# Patient Record
Sex: Male | Born: 1940 | Race: White | Hispanic: No | Marital: Married | Smoking: Former smoker
Health system: Southern US, Community
[De-identification: ages and names within clinical notes are randomized; demographics above are authoritative.]

## PROBLEM LIST (undated history)

## (undated) DIAGNOSIS — F1010 Alcohol abuse, uncomplicated: Secondary | ICD-10-CM

## (undated) DIAGNOSIS — E782 Mixed hyperlipidemia: Secondary | ICD-10-CM

## (undated) DIAGNOSIS — R41841 Cognitive communication deficit: Secondary | ICD-10-CM

## (undated) DIAGNOSIS — I700 Atherosclerosis of aorta: Secondary | ICD-10-CM

## (undated) DIAGNOSIS — I10 Essential (primary) hypertension: Secondary | ICD-10-CM

## (undated) HISTORY — DX: Alcohol abuse, uncomplicated: F10.10

## (undated) HISTORY — DX: Mixed hyperlipidemia: E78.2

## (undated) HISTORY — DX: Essential (primary) hypertension: I10

## (undated) HISTORY — DX: Atherosclerosis of aorta: I70.0

## (undated) HISTORY — DX: Cognitive communication deficit: R41.841

---

## 2018-01-27 DIAGNOSIS — G459 Transient cerebral ischemic attack, unspecified: Secondary | ICD-10-CM | POA: Diagnosis not present

## 2018-01-27 DIAGNOSIS — Z789 Other specified health status: Secondary | ICD-10-CM | POA: Diagnosis not present

## 2018-01-27 DIAGNOSIS — M545 Low back pain: Secondary | ICD-10-CM | POA: Diagnosis not present

## 2018-01-27 DIAGNOSIS — I160 Hypertensive urgency: Secondary | ICD-10-CM | POA: Diagnosis not present

## 2018-01-27 DIAGNOSIS — R739 Hyperglycemia, unspecified: Secondary | ICD-10-CM | POA: Diagnosis not present

## 2018-01-28 DIAGNOSIS — R739 Hyperglycemia, unspecified: Secondary | ICD-10-CM | POA: Diagnosis not present

## 2018-01-28 DIAGNOSIS — I160 Hypertensive urgency: Secondary | ICD-10-CM | POA: Diagnosis not present

## 2018-01-28 DIAGNOSIS — Z789 Other specified health status: Secondary | ICD-10-CM | POA: Diagnosis not present

## 2018-01-28 DIAGNOSIS — M545 Low back pain: Secondary | ICD-10-CM | POA: Diagnosis not present

## 2018-01-28 DIAGNOSIS — I517 Cardiomegaly: Secondary | ICD-10-CM | POA: Diagnosis not present

## 2018-01-28 DIAGNOSIS — G459 Transient cerebral ischemic attack, unspecified: Secondary | ICD-10-CM | POA: Diagnosis not present

## 2018-02-05 ENCOUNTER — Other Ambulatory Visit: Payer: Self-pay | Admitting: Physician Assistant

## 2018-02-05 DIAGNOSIS — M9973 Connective tissue and disc stenosis of intervertebral foramina of lumbar region: Secondary | ICD-10-CM

## 2018-02-13 ENCOUNTER — Ambulatory Visit
Admission: RE | Admit: 2018-02-13 | Discharge: 2018-02-13 | Disposition: A | Discharge status: Home / Self Care | Payer: Medicare Other | Source: Ambulatory Visit | Attending: Physician Assistant | Admitting: Physician Assistant

## 2018-02-13 DIAGNOSIS — M9973 Connective tissue and disc stenosis of intervertebral foramina of lumbar region: Secondary | ICD-10-CM

## 2019-09-15 IMAGING — MR MR LUMBAR SPINE W/O CM
4 of 5 series · 18 of 48 positions shown · non-contrast
Comparison: None.

CLINICAL DATA: Low back pain extending down the right leg with
weakness, worsening recently.

EXAM:
MRI LUMBAR SPINE WITHOUT CONTRAST
TECHNIQUE: Multiplanar, multisequence MR imaging of the lumbar spine was
performed. No intravenous contrast was administered.

[Series 6: T2 · sagittal · 4.0mm · 0.73mm/px · 6 of 15 slices shown (1 of 2)]
[im 1/15]
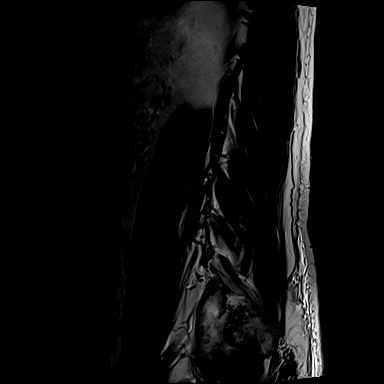
[im 3/15]
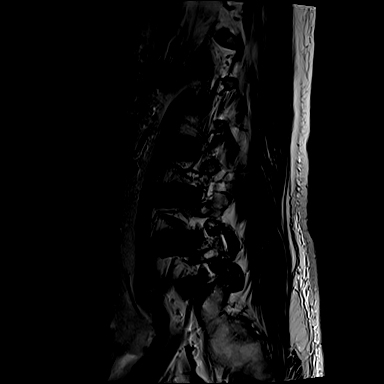
[im 6/15]
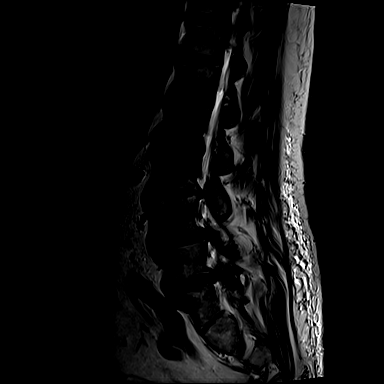
[im 9/15]
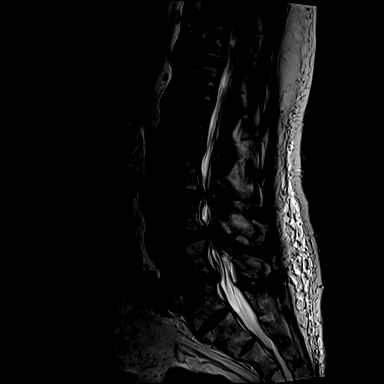
[im 12/15]
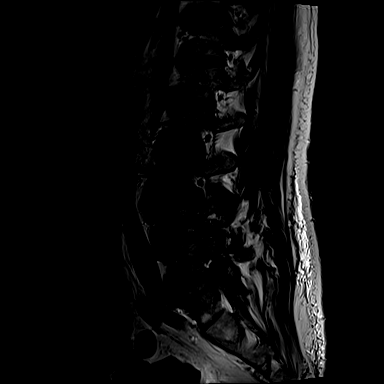
[im 15/15]
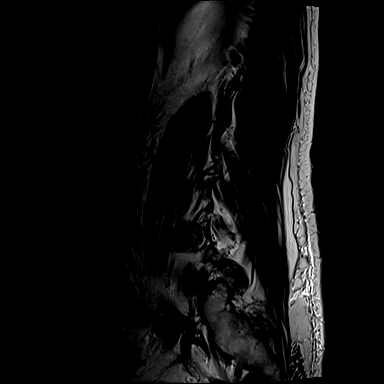

[Series 7: T1 · sagittal · 4.0mm · 0.73mm/px · 3 of 15 slices shown (1 of 2)]
[im 3/15]
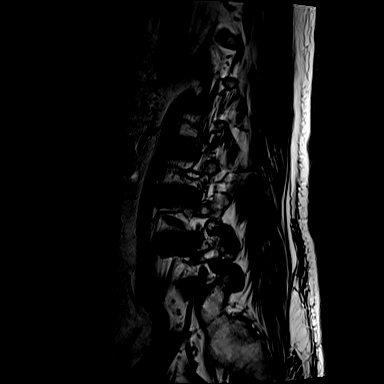
[im 9/15]
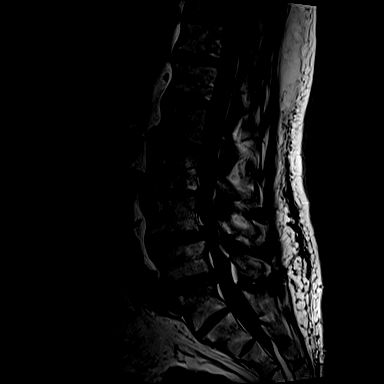
[im 15/15]
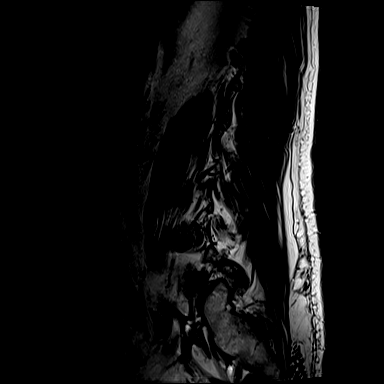

[Series 13: T2 · axial · 4.0mm · 0.28mm/px · z∈[-24,+160]mm · 6 of 40 slices shown (2 of 2)]
[im 1/40]
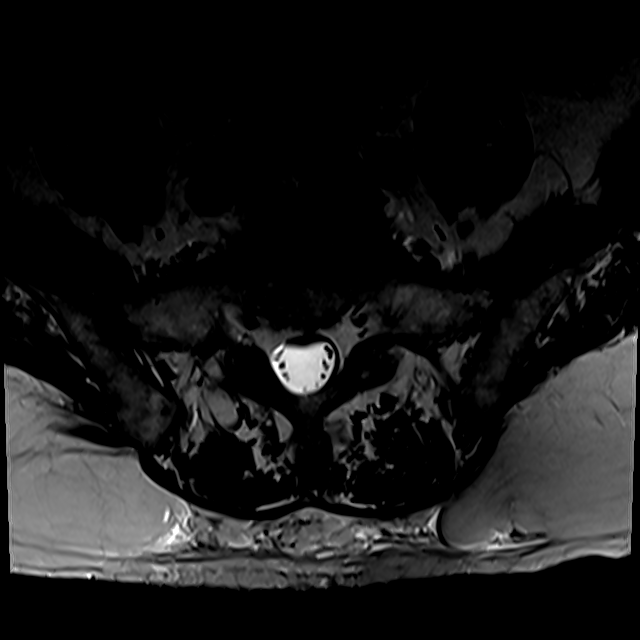
[im 6/40]
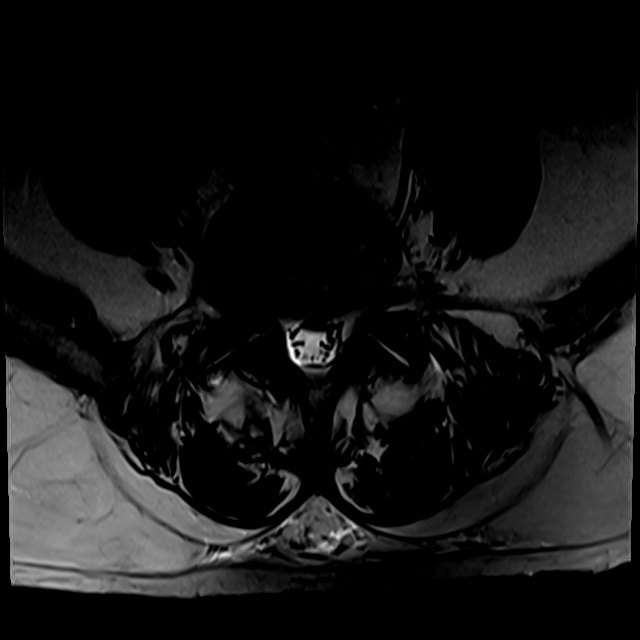
[im 12/40]
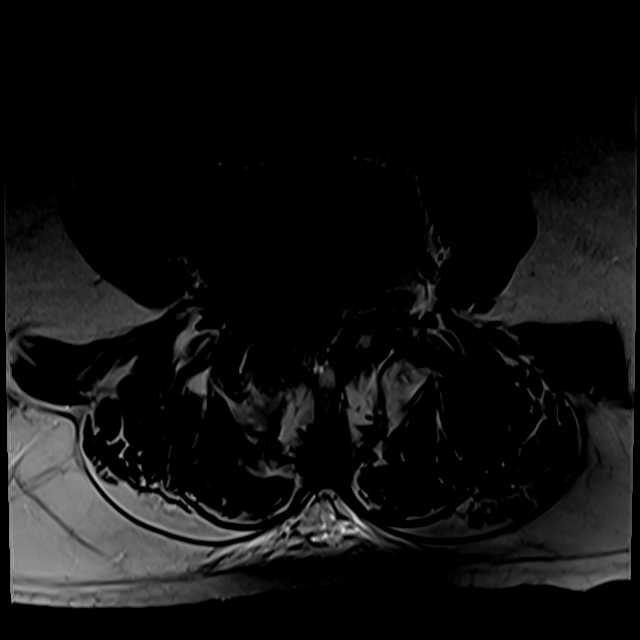
[im 17/40]
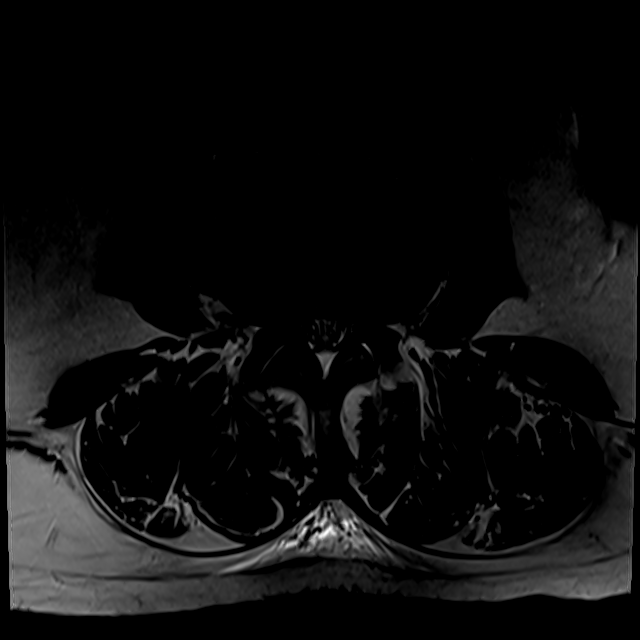
[im 20/40]
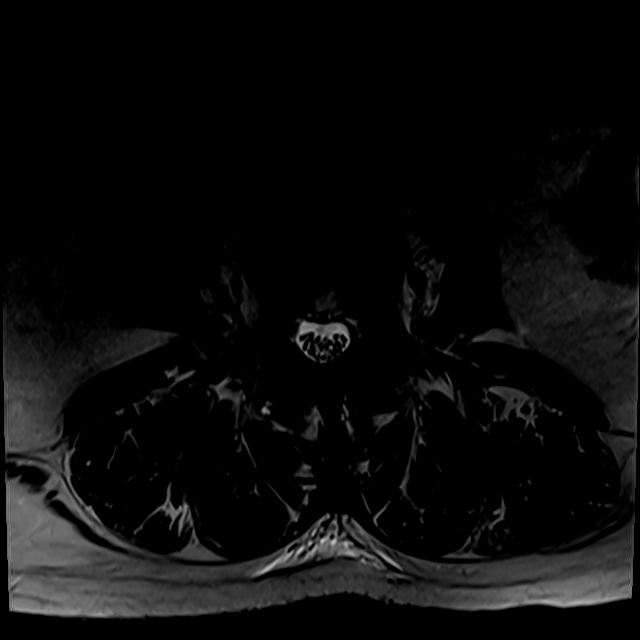
[im 34/40]
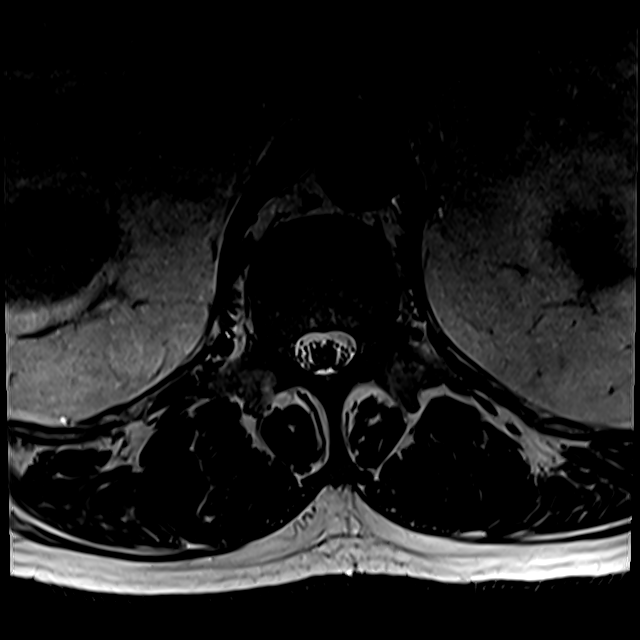

[Series 100: T1 · axial · 4.0mm · 0.28mm/px · z∈[+0,+160]mm · 3 of 40 slices shown (2 of 2)]
[im 6/40]
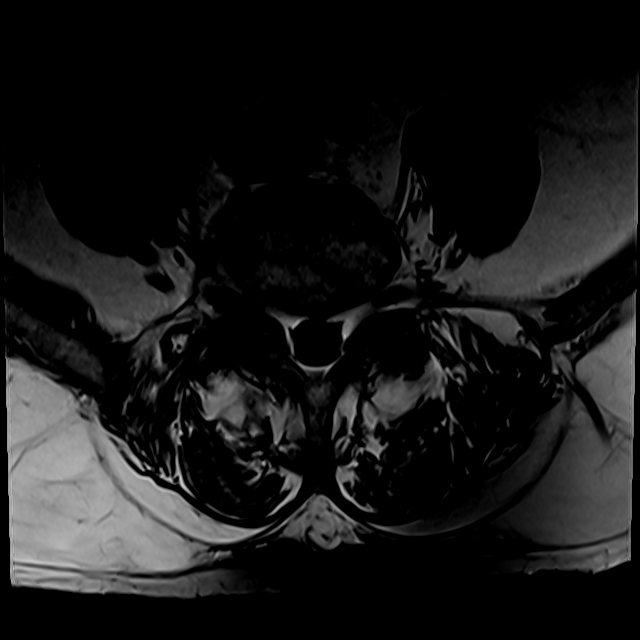
[im 20/40]
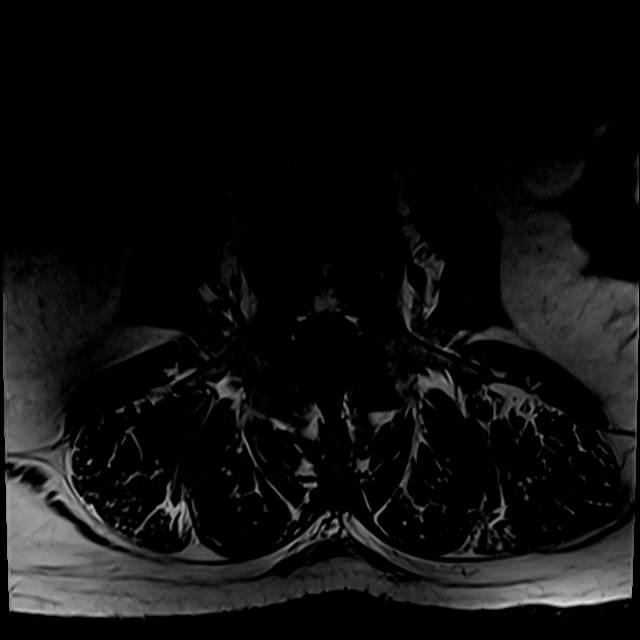
[im 34/40]
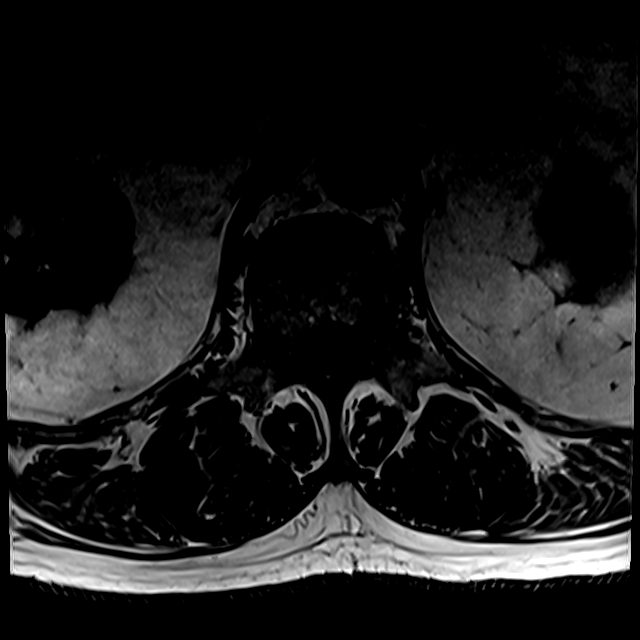

[18 of 48 positions shown; findings below may reference images not displayed]

FINDINGS: Segmentation:  S1 is described as a transitional vertebra.

Alignment: 2 mm retrolisthesis L3-4. 4 mm anterolisthesis L4-5. 2 mm
retrolisthesis L5-S1.

Vertebrae: Inferior endplate Schmorl's node at L3. Discogenic
endplate marrow changes at L5-S1.

Conus medullaris and cauda equina: Conus extends to the L1-2 level.
Conus and cauda equina appear normal.

Paraspinal and other soft tissues: Negative

Disc levels:

No significant finding at L2-3 or above. Minimal non-compressive
degenerative changes.

L3-4: Retrolisthesis of 2 mm. Disc degeneration with circumferential
bulging of the disc. Mild facet and ligamentous hypertrophy.
Stenosis of the lateral recesses that could possibly cause neural
compression.

L4-5: Advanced bilateral facet arthropathy with 4 mm of
anterolisthesis. Circumferential protrusion of the disc. Severe
multifactorial stenosis that could cause neural compression on
either or both sides. Foraminal stenosis on the right because of
osteophyte and disc material could focally compress the right L4
nerve.

L5-S1: 2 mm retrolisthesis. Circumferential disc protrusion with
small focal herniation in the left posterolateral direction. Facet
and ligamentous hypertrophy. Stenosis of the subarticular lateral
recesses left more than right could compress either S1 nerve,
particularly the left. Mild bilateral foraminal stenosis.

S1-2: Transitional and unremarkable.
IMPRESSION: S1 is described as a transitional vertebra.

The most pronounced findings are at L4-5 where there is severe
multifactorial spinal stenosis secondary to bilateral facet
arthropathy with 4 mm of anterolisthesis and circumferential
protrusion of the disc. Neural compression could occur on either
side. Additionally, there is right foraminal compromise by
osteophyte and disc material likely to focally compress the right L4
nerve.

L3-4 bilateral lateral recess narrowing with potential for neural
compression.

L5-S1 subarticular lateral recess stenosis left more than right due
to asymmetric disc herniation could compress either S1 nerve, more
likely the left.

## 2019-10-03 ENCOUNTER — Other Ambulatory Visit: Payer: Self-pay

## 2019-10-03 MED ORDER — LISINOPRIL-HYDROCHLOROTHIAZIDE 20-25 MG PO TABS: 1.0000 | ORAL_TABLET | Freq: Every day | ORAL | 1 refills | Status: DC

## 2019-11-27 ENCOUNTER — Encounter: Payer: Self-pay | Admitting: Family Medicine

## 2019-11-27 NOTE — Progress Notes (Signed)
Patient no showed

## 2019-11-30 DIAGNOSIS — E782 Mixed hyperlipidemia: Secondary | ICD-10-CM | POA: Insufficient documentation

## 2019-11-30 DIAGNOSIS — I10 Essential (primary) hypertension: Secondary | ICD-10-CM | POA: Insufficient documentation

## 2019-12-01 ENCOUNTER — Ambulatory Visit (INDEPENDENT_AMBULATORY_CARE_PROVIDER_SITE_OTHER): Payer: Medicare PPO | Admitting: Family Medicine

## 2019-12-01 DIAGNOSIS — E782 Mixed hyperlipidemia: Secondary | ICD-10-CM

## 2019-12-01 DIAGNOSIS — I10 Essential (primary) hypertension: Secondary | ICD-10-CM

## 2019-12-01 NOTE — Patient Instructions (Signed)
1. Essential hypertension Well controlled. No changes to medicines.  Continue to work on eating a healthy diet and exercise.  2. Mixed hyperlipidemia Continue to work on eating a healthy diet and exercise.

## 2019-12-10 ENCOUNTER — Other Ambulatory Visit: Payer: Self-pay | Admitting: Family Medicine

## 2019-12-17 NOTE — Progress Notes (Signed)
Subjective:  Patient ID: Walter Joseph, male    DOB: September 04, 1940  Age: 79 y.o. MRN: [id number]  Chief Complaint  Patient presents with  . Hypertension  . Hyperlipidemia  . Diabetes    HPI Pt presents for follow up of hypertension.  His current cardiac medication regimen includes a calcium channel blocker ( amlodipine ) and lisinopril/hctz.  Typical readings show systolics in the [id number] and diastolics in the 0000000 range.  He is tolerating the medication well without side effects.  Compliance with treatment has been good; he takes his medication as directed, maintains his diet and exercise regimen, and follows up as directed.  Concurrent health problems include hyperlipidemia.      Pt presents with hyperlipidemia.  Date of diagnosis 01/2018.  Current treatment includes Crestor and a low cholesterol/low fat diet.  He denies experiencing any hypercholesterolemia related symptoms.      Gemini presents with a diagnosis of impaired fasting glucose.  eating healthy.     Past Medical History:  Diagnosis Date  . Alcohol abuse   . Atherosclerosis of aorta ([place])   . Cognitive communication deficit   . Essential hypertension   . Mixed hyperlipidemia     Family History  Problem Relation Age of Onset  . Stroke Mother   . Heart failure Mother   . Diabetes Mother   . Stroke Father   . Heart failure Father   . Hypertension Father   . Heart attack Father   . Hyperlipidemia Other   . Arrhythmia Other    Social History   Socioeconomic History  . Marital status: Married    Spouse name: Not on file  . Number of children: 3  . Years of education: Not on file  . Highest education level: Not on file  Occupational History  . Occupation: retired  Tobacco Use  . Smoking status: Former Smoker    Packs/day: 1.00    Types: Cigarettes    Quit date: 1994    Years since quitting: 27.3  . Smokeless tobacco: Never Used  Substance and Sexual Activity  . Alcohol use: Yes    Alcohol/week: 2.0 - 3.0  standard drinks    Types: 2 - 3 Cans of beer per week    Comment: weekly. Been drinkning for 40 years or longer  . Drug use: Never  . Sexual activity: Not on file  Other Topics Concern  . Not on file  Social History Narrative  . Not on file   Social Determinants of Health   Financial Resource Strain:   . Difficulty of Paying Living Expenses:   Food Insecurity:   . Worried About Charity fundraiser in the Last Year:   . Arboriculturist in the Last Year:   Transportation Needs:   . Film/video editor (Medical):   Marland Kitchen Lack of Transportation (Non-Medical):   Physical Activity:   . Days of Exercise per Week:   . Minutes of Exercise per Session:   Stress:   . Feeling of Stress :   Social Connections:   . Frequency of Communication with Friends and Family:   . Frequency of Social Gatherings with Friends and Family:   . Attends Religious Services:   . Active Member of Clubs or Organizations:   . Attends Archivist Meetings:   Marland Kitchen Marital Status:     Review of Systems  Constitutional: Negative for chills, diaphoresis, fatigue and fever.  HENT: Negative for congestion, ear pain and sore throat.  Respiratory: Negative for cough and shortness of breath.   Cardiovascular: Negative for chest pain and leg swelling.  Gastrointestinal: Negative for abdominal pain, constipation, diarrhea, nausea and vomiting.  Genitourinary: Negative for dysuria and urgency.  Musculoskeletal: Negative for arthralgias and myalgias.  Neurological: Negative for dizziness and headaches.  Psychiatric/Behavioral: Negative for dysphoric mood.       No anhedonia.     Objective:  BP 122/70   Pulse 73   Temp 97.6 F (36.4 C)   Ht 5\' 11"  (1.803 m)   Wt 176 lb (79.8 kg)   SpO2 99%   BMI 24.55 kg/m   BP/Weight [id number]  Systolic BP [id number]  Diastolic BP 70  Wt. (Lbs) 176  BMI 24.55    Physical Exam Constitutional:      Appearance: Normal appearance.  Neck:     Vascular: No carotid  bruit.  Cardiovascular:     Rate and Rhythm: Normal rate and regular rhythm.     Pulses: Normal pulses.     Heart sounds: Normal heart sounds.  Pulmonary:     Effort: Pulmonary effort is normal.     Breath sounds: Normal breath sounds.  Abdominal:     General: Bowel sounds are normal.     Palpations: Abdomen is soft.     Tenderness: There is no abdominal tenderness.  Musculoskeletal:     Cervical back: Normal range of motion.     Right lower leg: No edema.     Left lower leg: No edema.  Neurological:     Mental Status: He is alert and oriented to person, place, and time.  Psychiatric:        Mood and Affect: Mood normal.        Behavior: Behavior normal.     No results found for: WBC, HGB, HCT, PLT, GLUCOSE, CHOL, TRIG, HDL, LDLDIRECT, LDLCALC, ALT, AST, NA, K, CL, CREATININE, BUN, CO2, TSH, PSA, INR, GLUF, HGBA1C, MICROALBUR    Assessment & Plan:  1. Essential hypertension Well controlled.  No changes to medicines.  Continue to work on eating a healthy diet and exercise.  Labs drawn today.  - Comprehensive metabolic panel - CBC with Differential/Platelet  2. Mixed hyperlipidemia Well controlled.  No changes to medicines.  Continue to work on eating a healthy diet and exercise.  Labs drawn today.  - Lipid panel  Orders Placed This Encounter  Procedures  . Lipid panel  . Comprehensive metabolic panel  . CBC with Differential/Platelet     Follow-up: Return in about 6 months (around 06/19/2020) for annual wellness visit with Dr. Tobie Poet, fasting. Marland Kitchen  An After Visit Summary was printed and given to the patient.  Rochel Brome [facility] [phone]

## 2019-12-19 ENCOUNTER — Other Ambulatory Visit: Payer: Self-pay

## 2019-12-19 ENCOUNTER — Encounter: Payer: Self-pay | Admitting: Family Medicine

## 2019-12-19 ENCOUNTER — Ambulatory Visit (INDEPENDENT_AMBULATORY_CARE_PROVIDER_SITE_OTHER): Payer: Medicare PPO | Admitting: Family Medicine

## 2019-12-19 VITALS — BP 122/70 | HR 73 | Temp 97.6°F | Ht 71.0 in | Wt 176.0 lb

## 2019-12-19 DIAGNOSIS — E782 Mixed hyperlipidemia: Secondary | ICD-10-CM | POA: Diagnosis not present

## 2019-12-19 DIAGNOSIS — I10 Essential (primary) hypertension: Secondary | ICD-10-CM

## 2019-12-20 LAB — COMPREHENSIVE METABOLIC PANEL
ALT: 23 IU/L (ref 0–44)
AST: 21 IU/L (ref 0–40)
Albumin/Globulin Ratio: 1.6 (ref 1.2–2.2)
Albumin: 4.6 g/dL (ref 3.7–4.7)
Alkaline Phosphatase: 67 IU/L (ref 39–117)
BUN/Creatinine Ratio: 14 (ref 10–24)
BUN: 13 mg/dL (ref 8–27)
Bilirubin Total: 0.7 mg/dL (ref 0.0–1.2)
CO2: 20 mmol/L (ref 20–29)
Calcium: 9.8 mg/dL (ref 8.6–10.2)
Chloride: 95 mmol/L — ABNORMAL LOW (ref 96–106)
Creatinine, Ser: 0.95 mg/dL (ref 0.76–1.27)
GFR calc Af Amer: 88 mL/min/{1.73_m2} (ref >59)
GFR calc non Af Amer: 76 mL/min/{1.73_m2} (ref >59)
Globulin, Total: 2.8 g/dL (ref 1.5–4.5)
Glucose: 99 mg/dL (ref 65–99)
Potassium: 4.3 mmol/L (ref 3.5–5.2)
Sodium: 133 mmol/L — ABNORMAL LOW (ref 134–144)
Total Protein: 7.4 g/dL (ref 6.0–8.5)

## 2019-12-20 LAB — CBC WITH DIFFERENTIAL/PLATELET
Basophils Absolute: 0.1 10*3/uL (ref 0.0–0.2)
Basos: 1 %
EOS (ABSOLUTE): 0.3 10*3/uL (ref 0.0–0.4)
Eos: 4 %
Hematocrit: 39.9 % (ref 37.5–51.0)
Hemoglobin: 13.9 g/dL (ref 13.0–17.7)
Immature Grans (Abs): 0.1 10*3/uL (ref 0.0–0.1)
Immature Granulocytes: 1 %
Lymphocytes Absolute: 2 10*3/uL (ref 0.7–3.1)
Lymphs: 28 %
MCH: 33.2 pg — ABNORMAL HIGH (ref 26.6–33.0)
MCHC: 34.8 g/dL (ref 31.5–35.7)
MCV: 95 fL (ref 79–97)
Monocytes Absolute: 0.7 10*3/uL (ref 0.1–0.9)
Monocytes: 10 %
Neutrophils Absolute: 3.9 10*3/uL (ref 1.4–7.0)
Neutrophils: 56 %
Platelets: 189 10*3/uL (ref 150–450)
RBC: 4.19 x10E6/uL (ref 4.14–5.80)
RDW: 11.8 % (ref 11.6–15.4)
WBC: 7 10*3/uL (ref 3.4–10.8)

## 2019-12-20 LAB — LIPID PANEL
Chol/HDL Ratio: 1.9 ratio (ref 0.0–5.0)
Cholesterol, Total: 171 mg/dL (ref 100–199)
HDL: 90 mg/dL (ref >39)
LDL Chol Calc (NIH): 70 mg/dL (ref 0–99)
Triglycerides: 55 mg/dL (ref 0–149)
VLDL Cholesterol Cal: 11 mg/dL (ref 5–40)

## 2019-12-20 LAB — CARDIOVASCULAR RISK ASSESSMENT

## 2019-12-23 ENCOUNTER — Other Ambulatory Visit: Payer: Self-pay | Admitting: Family Medicine

## 2020-03-11 ENCOUNTER — Other Ambulatory Visit: Payer: Self-pay | Admitting: Family Medicine

## 2020-05-11 ENCOUNTER — Telehealth (INDEPENDENT_AMBULATORY_CARE_PROVIDER_SITE_OTHER): Payer: Medicare PPO | Admitting: Family Medicine

## 2020-05-11 ENCOUNTER — Encounter: Payer: Self-pay | Admitting: Family Medicine

## 2020-05-11 VITALS — BP 131/74 | HR 70 | Temp 98.3°F | Ht 71.0 in | Wt 175.0 lb

## 2020-05-11 DIAGNOSIS — J018 Other acute sinusitis: Secondary | ICD-10-CM | POA: Diagnosis not present

## 2020-05-11 DIAGNOSIS — R05 Cough: Secondary | ICD-10-CM

## 2020-05-11 DIAGNOSIS — R059 Cough, unspecified: Secondary | ICD-10-CM

## 2020-05-11 MED ORDER — AZITHROMYCIN 250 MG PO TABS: ORAL_TABLET | ORAL | 0 refills | Status: DC

## 2020-05-11 NOTE — Progress Notes (Signed)
Virtual Visit via Telephone Note   This visit type was conducted due to national recommendations for restrictions regarding the COVID-19 Pandemic (e.g. social distancing) in an effort to limit this patient's exposure and mitigate transmission in our community.  Due to his co-morbid illnesses, this patient is at least at moderate risk for complications without adequate follow up.  This format is felt to be most appropriate for this patient at this time.  The patient did not have access to video technology/had technical difficulties with video requiring transitioning to audio format only (telephone).  All issues noted in this document were discussed and addressed.  No physical exam could be performed with this format.  Patient verbally consented to a telehealth visit.   Date:  05/11/2020   ID:  Walter Joseph, DOB Dec 09, 1940, MRN [id number]  Patient Location: Home Provider Location: Office/Clinic  PCP:  Rochel Brome, MD   Evaluation Performed: acute  Chief Complaint:  cough  History of Present Illness:    Walter Joseph is a 79 y.o. male with URI symptoms that started last Friday. Symptoms include coughing (mildly productive), congestion, sneezing. No sore throat. Some body aches. No fever. No headaches. Smell and taste are okay. Denies sob.   Reports taking a at home covid test yesterday which was negative. Has taken a OTC for congestion which did help some.  The patient does have symptoms concerning for COVID-19 infection (fever, chills, cough, or new shortness of breath).    Past Medical History:  Diagnosis Date  . Alcohol abuse   . Atherosclerosis of aorta ([place])   . Cognitive communication deficit   . Essential hypertension   . Mixed hyperlipidemia     History reviewed. No pertinent surgical history.  Family History  Problem Relation Age of Onset  . Stroke Mother   . Heart failure Mother   . Diabetes Mother   . Stroke Father   . Heart failure Father   . Hypertension Father   .  Heart attack Father   . Hyperlipidemia Other   . Arrhythmia Other     Social History   Socioeconomic History  . Marital status: Married    Spouse name: Not on file  . Number of children: 3  . Years of education: Not on file  . Highest education level: Not on file  Occupational History  . Occupation: retired  Tobacco Use  . Smoking status: Former Smoker    Packs/day: 1.00    Types: Cigarettes    Quit date: 1994    Years since quitting: 27.7  . Smokeless tobacco: Never Used  Substance and Sexual Activity  . Alcohol use: Yes    Alcohol/week: 2.0 - 3.0 standard drinks    Types: 2 - 3 Cans of beer per week    Comment: weekly. Been drinkning for 40 years or longer  . Drug use: Never  . Sexual activity: Not on file  Other Topics Concern  . Not on file  Social History Narrative  . Not on file   Social Determinants of Health   Financial Resource Strain:   . Difficulty of Paying Living Expenses: Not on file  Food Insecurity:   . Worried About Charity fundraiser in the Last Year: Not on file  . Ran Out of Food in the Last Year: Not on file  Transportation Needs:   . Lack of Transportation (Medical): Not on file  . Lack of Transportation (Non-Medical): Not on file  Physical Activity:   . Days of  Exercise per Week: Not on file  . Minutes of Exercise per Session: Not on file  Stress:   . Feeling of Stress : Not on file  Social Connections:   . Frequency of Communication with Friends and Family: Not on file  . Frequency of Social Gatherings with Friends and Family: Not on file  . Attends Religious Services: Not on file  . Active Member of Clubs or Organizations: Not on file  . Attends Archivist Meetings: Not on file  . Marital Status: Not on file  Intimate Partner Violence:   . Fear of Current or Ex-Partner: Not on file  . Emotionally Abused: Not on file  . Physically Abused: Not on file  . Sexually Abused: Not on file    Outpatient Medications Prior to  Visit  Medication Sig Dispense Refill  . amLODipine (NORVASC) 10 MG tablet TAKE 1 TABLET BY MOUTH ONCE (1) DAILY 90 tablet 0  . aspirin 81 MG EC tablet Take by mouth.    Marland Kitchen lisinopril-hydrochlorothiazide (ZESTORETIC) 20-25 MG tablet Take 1 tablet by mouth daily. 90 tablet 1  . rosuvastatin (CRESTOR) 5 MG tablet TAKE 1 TABLET BY MOUTH AT BEDTIME FOR CHOLESTEROL. 90 tablet 0   No facility-administered medications prior to visit.   .med Allergies:   Penicillin g and Tetracycline   Social History   Tobacco Use  . Smoking status: Former Smoker    Packs/day: 1.00    Types: Cigarettes    Quit date: 1994    Years since quitting: 27.7  . Smokeless tobacco: Never Used  Substance Use Topics  . Alcohol use: Yes    Alcohol/week: 2.0 - 3.0 standard drinks    Types: 2 - 3 Cans of beer per week    Comment: weekly. Been drinkning for 40 years or longer  . Drug use: Never     Review of Systems  Constitutional: Negative for chills and fever.  HENT: Positive for congestion, ear pain and sinus pain. Negative for sore throat.   Respiratory: Positive for cough and shortness of breath.   Gastrointestinal: Negative for diarrhea, nausea and vomiting.  Musculoskeletal: Negative for joint pain.  Neurological: Negative for dizziness and headaches.     Labs/Other Tests and Data Reviewed:    Recent Labs: 12/19/2019: ALT 23; BUN 13; Creatinine, Ser 0.95; Hemoglobin 13.9; Platelets 189; Potassium 4.3; Sodium 133   Recent Lipid Panel Lab Results  Component Value Date/Time   CHOL 171 12/19/2019 08:17 AM   TRIG 55 12/19/2019 08:17 AM   HDL 90 12/19/2019 08:17 AM   CHOLHDL 1.9 12/19/2019 08:17 AM   LDLCALC 70 12/19/2019 08:17 AM    Wt Readings from Last 3 Encounters:  05/11/20 175 lb (79.4 kg)  12/19/19 176 lb (79.8 kg)     Objective:    Vital Signs:  BP 131/74   Pulse 70   Temp 98.3 F (36.8 C)   Ht 5\' 11"  (1.803 m)   Wt 175 lb (79.4 kg)   BMI 24.41 kg/m    Physical Exam   ASSESSMENT  & PLAN:   1. Acute non-recurrent sinusitis of other sinus zpack prescribed.   2. Cough - continue otc cough medicines.  If worsens, and would like to have a send off pcr covid 19 should call us.  Recommended staying home as much as possible, masking, and keeping 6 feet distance from others.    COVID-19 Education: The signs and symptoms of COVID-19 were discussed with the patient and how to seek  care for testing (follow up with PCP or arrange E-visit). The importance of social distancing was discussed today.  Time:   Today, I have spent 10 minutes with the patient with telehealth technology discussing the above problems.    Follow Up:  In Person prn  Signed, Rochel Brome, MD  05/11/2020 10:22 AM    [place]

## 2020-06-17 NOTE — Patient Instructions (Addendum)
[name] , Thank you for taking time to come for your Medicare Wellness Visit. I appreciate your ongoing commitment to your health goals. Please review the following plan we discussed and let me know if I can assist you in the future.   These are the goals we discussed: Goals   Recommend continue to work on eating healthy diet and exercise.      Continue to drink alcohol at current level or decrease.     This is a list of the screening recommended for you and due dates:  Health Maintenance  Topic Date Due  .  Hepatitis C: One time screening is recommended by Center for Disease Control  (CDC) for  adults born from [birth year] through 1965.   Recommend to check for hepatitis C at his next blood draw.   . Tetanus Vaccine  Never done  . Pneumonia vaccines (1 of 2 - PCV13) Never done  . Flu Shot  Never done  . COVID-19 Vaccine  Completed    Preventive Care 34 Years and Older, Male Preventive care refers to lifestyle choices and visits with your health care provider that can promote health and wellness. This includes:  A yearly physical exam. This is also called an annual well check.  Regular dental and eye exams.  Immunizations.  Screening for certain conditions.  Healthy lifestyle choices, such as diet and exercise. What can I expect for my preventive care visit? Physical exam Your health care provider will check:  Height and weight. These may be used to calculate body mass index (BMI), which is a measurement that tells if you are at a healthy weight.  Heart rate and blood pressure.  Your skin for abnormal spots. Counseling Your health care provider may ask you questions about:  Alcohol, tobacco, and drug use.  Emotional well-being.  Home and relationship well-being.  Sexual activity.  Eating habits.  History of falls.  Memory and ability to understand (cognition).  Work and work Statistician. What immunizations do I need? Influenza (flu) vaccine  This is recommended  every year Pneumococcal conjugate (PCV13) vaccine  One dose is recommended after age 67. Pneumococcal polysaccharide (PPSV23) vaccine  One dose is recommended after age 72.   Recommend get vaccines below at the pharmacy as medicare covers the there. Tetanus, diphtheria, and pertussis (Tdap) vaccine  You may need a Td booster every 10 years. Shingrix (shingles) vaccine  You may need this after age 38.  You may receive vaccines as individual doses or as more than one vaccine together in one shot (combination vaccines). Talk with your health care provider about the risks and benefits of combination vaccines. What tests do I need? Blood tests (will do in April 2022  Lipid and cholesterol levels. These may be checked every 5 years, or more frequently depending on your overall health.  Hepatitis C test.  Hepatitis B test.  Screening  Lung cancer screening. You may have this screening every year starting at age 38 if you have a 30-pack-year history of smoking and currently smoke or have quit within the past 15 years.  Colorectal cancer screening. All adults should have this screening starting at age 65 and continuing until age 91. Your health care provider may recommend screening at age 14 if you are at increased risk. You will have tests every 1-10 years, depending on your results and the type of screening test.  Prostate cancer screening. Recommendations will vary depending on your family history and other risks.  Refused above  screening. Follow these instructions at home: Eating and drinking  Eat a diet that includes fresh fruits and vegetables, whole grains, lean protein, and low-fat dairy products. Limit your intake of foods with high amounts of sugar, saturated fats, and salt.  Take vitamin and mineral supplements as recommended by your health care provider.  Do not drink alcohol if your health care provider tells you not to drink.  If you drink alcohol: ? Limit how much  you have to 0-2 drinks a day. ? Be aware of how much alcohol is in your drink. In the [place] one drink equals one 12 oz bottle of beer (355 mL), one 5 oz glass of wine (148 mL), or one 1 oz glass of hard liquor (44 mL). Lifestyle  Take daily care of your teeth and gums.  Stay active. Exercise for at least 30 minutes on 5 or more days each week.  Do not use any products that contain nicotine or tobacco, such as cigarettes, e-cigarettes, and chewing tobacco. If you need help quitting, ask your health care provider.  If you are sexually active, practice safe sex. Use a condom or other form of protection to prevent STIs (sexually transmitted infections).  Talk with your health care provider about taking a low-dose aspirin or statin. What's next?  Visit your health care provider once a year for a well check visit.  Ask your health care provider how often you should have your eyes and teeth checked.  Stay up to date on all vaccines. This information is not intended to replace advice given to you by your health care provider. Make sure you discuss any questions you have with your health care provider. Document Revised: 08/01/2018 Document Reviewed: 08/01/2018 Elsevier Patient Education  2020 [employer].

## 2020-06-17 NOTE — Progress Notes (Signed)
Subjective:  Patient ID: Walter Joseph, male    DOB: [birth year]  Age: 79 y.o. MRN: [id number]  Chief Complaint  Patient presents with  . Annual Exam    HPI Well Adult Physical: Patient here for a comprehensive physical exam. Do you take any herbs or supplements that were not prescribed by a doctor? no Are you taking calcium supplements? no Are you taking aspirin daily? yes  Encounter for general adult medical examination without abnormal findings  Physical ("At Risk" items are starred): Patient's last physical exam was 1 year ago .  Smoking: Former smoker ;  Physical Activity: Exercises at least 3 times per week ;  Eats fairly healthy.  Alcohol/Drug Use: Is a drinker; No illicit drug use ;  Patient is not afflicted from Stress Incontinence and Urge Incontinence  Safety: reviewed. Patient wears a seat belt, has smoke detectors, has carbon monoxide detectors, practices appropriate gun safety, and wears sunscreen with extended sun exposure. Dental Care: biannual cleanings, brushes and flosses daily. Ophthalmology/Optometry: Annual visit.  Hearing loss: none Vision impairments: Wears glasses  PSA:    Office Visit from 06/18/2020 in [facility]  PHQ-2 Total Score 0      Current Exercise Habits: Home exercise routine, Type of exercise: walking, Time (Minutes): 30, Frequency (Times/Week): 4, Weekly Exercise (Minutes/Week): 120    Social Hx   Social History   Socioeconomic History  . Marital status: Married    Spouse name: Not on file  . Number of children: 3  . Years of education: Not on file  . Highest education level: Not on file  Occupational History  . Occupation: retired  Tobacco Use  . Smoking status: Former Smoker    Packs/day: 1.00    Types: Cigarettes    Quit date: 1994    Years since quitting: 27.8  . Smokeless tobacco: Never Used  Substance and Sexual Activity  . Alcohol use: Yes    Alcohol/week: 2.0 - 3.0 standard drinks    Types: 2 - 3 Cans of beer per  week  . Drug use: Never  . Sexual activity: Not on file  Other Topics Concern  . Not on file  Social History Narrative  . Not on file   Social Determinants of Health   Financial Resource Strain:   . Difficulty of Paying Living Expenses: Not on file  Food Insecurity:   . Worried About Charity fundraiser in the Last Year: Not on file  . Ran Out of Food in the Last Year: Not on file  Transportation Needs:   . Lack of Transportation (Medical): Not on file  . Lack of Transportation (Non-Medical): Not on file  Physical Activity:   . Days of Exercise per Week: Not on file  . Minutes of Exercise per Session: Not on file  Stress:   . Feeling of Stress : Not on file  Social Connections:   . Frequency of Communication with Friends and Family: Not on file  . Frequency of Social Gatherings with Friends and Family: Not on file  . Attends Religious Services: Not on file  . Active Member of Clubs or Organizations: Not on file  . Attends Archivist Meetings: Not on file  . Marital Status: Not on file   Past Medical History:  Diagnosis Date  . Alcohol abuse   . Atherosclerosis of aorta ([place])   . Cognitive communication deficit   . Essential hypertension   . Mixed hyperlipidemia    History reviewed. No  pertinent surgical history.  Family History  Problem Relation Age of Onset  . Stroke Mother   . Heart failure Mother   . Diabetes Mother   . Stroke Father   . Heart failure Father   . Hypertension Father   . Heart attack Father   . Hyperlipidemia Other   . Arrhythmia Other     Review of Systems  Constitutional: Negative for chills, diaphoresis, fatigue and fever.  HENT: Negative for congestion, ear pain and sore throat.   Respiratory: Negative for cough and shortness of breath.   Cardiovascular: Negative for chest pain and leg swelling.  Gastrointestinal: Negative for abdominal pain, constipation, diarrhea, nausea and vomiting.  Genitourinary: Negative for dysuria and  urgency.  Musculoskeletal: Negative for arthralgias and myalgias.  Neurological: Negative for dizziness and headaches.  Psychiatric/Behavioral: Negative for dysphoric mood.    Objective:  BP 136/82   Pulse 70   Temp 98 F (36.7 C)   Ht 5\' 11"  (1.803 m)   Wt 182 lb (82.6 kg)   SpO2 100%   BMI 25.38 kg/m   BP/Weight 06/18/2020 05/11/2020 9/92/4268  Systolic BP 341 962 229  Diastolic BP 82 74 70  Wt. (Lbs) 182 175 176  BMI 25.38 24.41 24.55    Physical Exam Vitals reviewed.  Constitutional:      Appearance: Normal appearance. He is normal weight.  Neurological:     Mental Status: He is alert and oriented to person, place, and time.  Psychiatric:        Mood and Affect: Mood normal.        Behavior: Behavior normal.     Lab Results  Component Value Date   WBC 7.0 12/19/2019   HGB 13.9 12/19/2019   HCT 39.9 12/19/2019   PLT 189 12/19/2019   GLUCOSE 99 12/19/2019   CHOL 171 12/19/2019   TRIG 55 12/19/2019   HDL 90 12/19/2019   LDLCALC 70 12/19/2019   ALT 23 12/19/2019   AST 21 12/19/2019   NA 133 (L) 12/19/2019   K 4.3 12/19/2019   CL 95 (L) 12/19/2019   CREATININE 0.95 12/19/2019   BUN 13 12/19/2019   CO2 20 12/19/2019      Assessment & Plan:  1. Encounter for annual wellness exam in Medicare patient [place] male.  The current medical regimen is effective;  continue present plan and medications. Recommend continue to work on eating healthy diet and exercise. Monitor alcohol. Do not increase.  Continue aspirin 81 mg daily.  2. Aortic atherosclerosis. The current medical regimen is effective;  continue present plan and medications. Recommend aspirin 81 mg once daily.  3. Flu vaccine given.  FLUAD.  Body mass index is 25.38 kg/m.   These are the goals we discussed: Goals   Recommend continue to work on eating healthy diet and exercise.     Watch alcohol intake     This is a list of the screening recommended for you and due dates:    Health Maintenance  Topic Date Due  . Flu Shot  06/18/2020  .  Hepatitis C: One time screening is recommended by Center for Disease Control  (CDC) for  adults born from [birth year] through 1965.   12/08/2020*  . Tetanus Vaccine  06/18/2021*  . Pneumonia vaccines (1 of 2 - PCV13) 06/18/2021*  . COVID-19 Vaccine  Completed  *Topic was postponed. The date shown is not the original due date.     AN INDIVIDUALIZED CARE PLAN: was established or  reinforced today.   SELF MANAGEMENT: The patient and I together assessed ways to personally work towards obtaining the recommended goals  Support needs The patient and/or family needs were assessed and services were offered if appropriate.  No orders of the defined types were placed in this encounter.   Follow-up: Return in about 6 months (around 12/17/2020) for fasting.  An After Visit Summary was printed and given to the patient.  Rochel Brome [facility] [phone]

## 2020-06-18 ENCOUNTER — Ambulatory Visit (INDEPENDENT_AMBULATORY_CARE_PROVIDER_SITE_OTHER): Payer: Medicare PPO | Admitting: Family Medicine

## 2020-06-18 ENCOUNTER — Encounter: Payer: Self-pay | Admitting: Family Medicine

## 2020-06-18 ENCOUNTER — Other Ambulatory Visit: Payer: Self-pay

## 2020-06-18 VITALS — BP 136/82 | HR 70 | Temp 98.0°F | Ht 71.0 in | Wt 182.0 lb

## 2020-06-18 DIAGNOSIS — Z23 Encounter for immunization: Secondary | ICD-10-CM | POA: Diagnosis not present

## 2020-06-18 DIAGNOSIS — Z0000 Encounter for general adult medical examination without abnormal findings: Secondary | ICD-10-CM

## 2020-06-18 DIAGNOSIS — I700 Atherosclerosis of aorta: Secondary | ICD-10-CM | POA: Diagnosis not present

## 2020-06-18 DIAGNOSIS — E782 Mixed hyperlipidemia: Secondary | ICD-10-CM

## 2020-06-18 DIAGNOSIS — I10 Essential (primary) hypertension: Secondary | ICD-10-CM

## 2020-06-29 ENCOUNTER — Telehealth: Payer: Self-pay

## 2020-06-29 NOTE — Telephone Encounter (Signed)
Patient left a voicemail requesting a rx for his pneumonia vaccine to be sent to the pharmacy.

## 2020-06-30 ENCOUNTER — Other Ambulatory Visit: Payer: Self-pay | Admitting: Family Medicine

## 2020-06-30 NOTE — Telephone Encounter (Signed)
Sent. Kc  °

## 2020-07-01 ENCOUNTER — Other Ambulatory Visit: Payer: Self-pay

## 2020-07-01 DIAGNOSIS — Z23 Encounter for immunization: Secondary | ICD-10-CM

## 2020-07-01 MED ORDER — PNEUMOCOCCAL VAC POLYVALENT 25 MCG/0.5ML IJ INJ: 0.5000 mL | INJECTION | Freq: Once | INTRAMUSCULAR | 0 refills | Status: AC

## 2020-07-29 ENCOUNTER — Other Ambulatory Visit: Payer: Self-pay | Admitting: Family Medicine

## 2020-08-09 ENCOUNTER — Other Ambulatory Visit: Payer: Self-pay | Admitting: Family Medicine

## 2020-09-24 ENCOUNTER — Other Ambulatory Visit: Payer: Self-pay | Admitting: Family Medicine

## 2020-12-06 ENCOUNTER — Other Ambulatory Visit: Payer: Self-pay | Admitting: Family Medicine

## 2020-12-16 NOTE — Progress Notes (Signed)
Subjective:  Patient ID: Walter Joseph, male    DOB: 1941/07/12  Age: 80 y.o. MRN: [id number]  Chief Complaint  Patient presents with  . Hypertension  . Hyperlipidemia    HPI Essential hypertension Amlodipine, Lisinopril-hctz Eating pretty healthy. Exercising.   Mixed hyperlipidemia/Carotid atherosclerosis/Atherosclerosis of aorta Crestor 5 mg once daily. Tolerating all medications.  Patient is asking if due for another carotid doppers.    Current Outpatient Medications on File Prior to Visit  Medication Sig Dispense Refill  . amLODipine (NORVASC) 10 MG tablet TAKE ONE TABLET BY MOUTH EVERY DAY 90 tablet 0  . aspirin 81 MG EC tablet Take by mouth.    Marland Kitchen lisinopril-hydrochlorothiazide (ZESTORETIC) 20-25 MG tablet TAKE 1 TABLET BY MOUTH ONCE (1) DAILY 90 tablet 1  . rosuvastatin (CRESTOR) 5 MG tablet TAKE ONE TABLET BY MOUTH AT BEDTIME FOR CHOLESTEROL 90 tablet 1   No current facility-administered medications on file prior to visit.   Past Medical History:  Diagnosis Date  . Alcohol abuse   . Atherosclerosis of aorta ([place])   . Cognitive communication deficit   . Essential hypertension   . Mixed hyperlipidemia    History reviewed. No pertinent surgical history.  Family History  Problem Relation Age of Onset  . Stroke Mother   . Heart failure Mother   . Diabetes Mother   . Stroke Father   . Heart failure Father   . Hypertension Father   . Heart attack Father   . Hyperlipidemia Other   . Arrhythmia Other    Social History   Socioeconomic History  . Marital status: Married    Spouse name: Not on file  . Number of children: 3  . Years of education: Not on file  . Highest education level: Not on file  Occupational History  . Occupation: retired  Tobacco Use  . Smoking status: Former Smoker    Packs/day: 1.00    Types: Cigarettes    Quit date: 1994    Years since quitting: 28.3  . Smokeless tobacco: Never Used  Substance and Sexual Activity  . Alcohol use: Yes     Alcohol/week: 2.0 - 3.0 standard drinks    Types: 2 - 3 Cans of beer per week  . Drug use: Never  . Sexual activity: Not on file  Other Topics Concern  . Not on file  Social History Narrative  . Not on file   Social Determinants of Health   Financial Resource Strain: Not on file  Food Insecurity: Not on file  Transportation Needs: Not on file  Physical Activity: Not on file  Stress: Not on file  Social Connections: Not on file    Review of Systems  Constitutional: Negative for chills, diaphoresis, fatigue and fever.  HENT: Negative for congestion, ear pain (Feels like he has something in his ear  (water)) and sore throat.   Respiratory: Negative for cough and shortness of breath.   Cardiovascular: Negative for chest pain and leg swelling.  Gastrointestinal: Negative for abdominal pain, constipation, diarrhea, nausea and vomiting.  Genitourinary: Negative for dysuria and urgency.  Musculoskeletal: Negative for arthralgias and myalgias.  Neurological: Negative for dizziness and headaches.  Psychiatric/Behavioral: Negative for dysphoric mood.     Objective:  BP 134/62   Pulse 78   Temp (!) 97.4 F (36.3 C)   Ht 5\' 11"  (1.803 m)   Wt 177 lb (80.3 kg)   SpO2 98%   BMI 24.69 kg/m   BP/Weight 12/17/2020 06/18/2020 0/25/8527  Systolic  BP 983 382 505  Diastolic BP 62 82 74  Wt. (Lbs) 177 182 175  BMI 24.69 25.38 24.41    Physical Exam Vitals reviewed.  Constitutional:      Appearance: Normal appearance. He is normal weight.  HENT:     Right Ear: Tympanic membrane normal.     Left Ear: Tympanic membrane normal.     Nose: Nose normal.     Mouth/Throat:     Pharynx: No oropharyngeal exudate or posterior oropharyngeal erythema.  Neck:     Vascular: No carotid bruit.  Cardiovascular:     Rate and Rhythm: Normal rate and regular rhythm.     Heart sounds: No murmur heard.   Pulmonary:     Effort: Pulmonary effort is normal.     Breath sounds: Normal breath  sounds.  Abdominal:     General: Abdomen is flat. Bowel sounds are normal.     Palpations: Abdomen is soft.     Tenderness: There is no abdominal tenderness.  Neurological:     Mental Status: He is alert and oriented to person, place, and time.  Psychiatric:        Mood and Affect: Mood normal.        Behavior: Behavior normal.     Diabetic Foot Exam - Simple   No data filed      Lab Results  Component Value Date   WBC 7.0 12/19/2019   HGB 13.9 12/19/2019   HCT 39.9 12/19/2019   PLT 189 12/19/2019   GLUCOSE 99 12/19/2019   CHOL 171 12/19/2019   TRIG 55 12/19/2019   HDL 90 12/19/2019   LDLCALC 70 12/19/2019   ALT 23 12/19/2019   AST 21 12/19/2019   NA 133 (L) 12/19/2019   K 4.3 12/19/2019   CL 95 (L) 12/19/2019   CREATININE 0.95 12/19/2019   BUN 13 12/19/2019   CO2 20 12/19/2019      Assessment & Plan:   1. Essential hypertension Well controlled.  No changes to medicines.  Continue to work on eating a healthy diet and exercise.  Labs drawn today.  - Comprehensive metabolic panel  2. Mixed hyperlipidemia Well controlled.  No changes to medicines.  Continue to work on eating a healthy diet and exercise.  Labs drawn today.  - CBC with Differential/Platelet - Lipid panel  3. Atherosclerosis of both carotid arteries - US Carotid Duplex Bilateral  - continue crestor and aspirin.  4. Aortic atherosclerosis - continue crestor and aspirin.  No orders of the defined types were placed in this encounter.   Orders Placed This Encounter  Procedures  . US Carotid Duplex Bilateral  . CBC with Differential/Platelet  . Comprehensive metabolic panel  . Lipid panel     Follow-up: No follow-ups on file.  An After Visit Summary was printed and given to the patient.  Rochel Brome, MD [facility] [phone]

## 2020-12-17 ENCOUNTER — Ambulatory Visit: Payer: Medicare PPO | Admitting: Family Medicine

## 2020-12-17 ENCOUNTER — Other Ambulatory Visit: Payer: Self-pay

## 2020-12-17 ENCOUNTER — Encounter: Payer: Self-pay | Admitting: Family Medicine

## 2020-12-17 VITALS — BP 134/62 | HR 78 | Temp 97.4°F | Ht 71.0 in | Wt 177.0 lb

## 2020-12-17 DIAGNOSIS — I10 Essential (primary) hypertension: Secondary | ICD-10-CM | POA: Diagnosis not present

## 2020-12-17 DIAGNOSIS — I6523 Occlusion and stenosis of bilateral carotid arteries: Secondary | ICD-10-CM

## 2020-12-17 DIAGNOSIS — I700 Atherosclerosis of aorta: Secondary | ICD-10-CM | POA: Diagnosis not present

## 2020-12-17 DIAGNOSIS — E782 Mixed hyperlipidemia: Secondary | ICD-10-CM

## 2020-12-18 LAB — CBC WITH DIFFERENTIAL/PLATELET
Basophils Absolute: 0.1 10*3/uL (ref 0.0–0.2)
Basos: 1 %
EOS (ABSOLUTE): 0.3 10*3/uL (ref 0.0–0.4)
Eos: 5 %
Hematocrit: 40.3 % (ref 37.5–51.0)
Hemoglobin: 14 g/dL (ref 13.0–17.7)
Immature Grans (Abs): 0 10*3/uL (ref 0.0–0.1)
Immature Granulocytes: 1 %
Lymphocytes Absolute: 2.3 10*3/uL (ref 0.7–3.1)
Lymphs: 31 %
MCH: 32.1 pg (ref 26.6–33.0)
MCHC: 34.7 g/dL (ref 31.5–35.7)
MCV: 92 fL (ref 79–97)
Monocytes Absolute: 0.8 10*3/uL (ref 0.1–0.9)
Monocytes: 10 %
Neutrophils Absolute: 3.8 10*3/uL (ref 1.4–7.0)
Neutrophils: 52 %
Platelets: 186 10*3/uL (ref 150–450)
RBC: 4.36 x10E6/uL (ref 4.14–5.80)
RDW: 11.7 % (ref 11.6–15.4)
WBC: 7.2 10*3/uL (ref 3.4–10.8)

## 2020-12-18 LAB — COMPREHENSIVE METABOLIC PANEL
ALT: 28 IU/L (ref 0–44)
AST: 26 IU/L (ref 0–40)
Albumin/Globulin Ratio: 1.5 (ref 1.2–2.2)
Albumin: 4.6 g/dL (ref 3.7–4.7)
Alkaline Phosphatase: 70 IU/L (ref 44–121)
BUN/Creatinine Ratio: 11 (ref 10–24)
BUN: 10 mg/dL (ref 8–27)
Bilirubin Total: 0.7 mg/dL (ref 0.0–1.2)
CO2: 22 mmol/L (ref 20–29)
Calcium: 9.7 mg/dL (ref 8.6–10.2)
Chloride: 90 mmol/L — ABNORMAL LOW (ref 96–106)
Creatinine, Ser: 0.95 mg/dL (ref 0.76–1.27)
Globulin, Total: 3 g/dL (ref 1.5–4.5)
Glucose: 107 mg/dL — ABNORMAL HIGH (ref 65–99)
Potassium: 4.2 mmol/L (ref 3.5–5.2)
Sodium: 128 mmol/L — ABNORMAL LOW (ref 134–144)
Total Protein: 7.6 g/dL (ref 6.0–8.5)
eGFR: 81 mL/min/{1.73_m2} (ref >59)

## 2020-12-18 LAB — LIPID PANEL
Chol/HDL Ratio: 1.8 ratio (ref 0.0–5.0)
Cholesterol, Total: 163 mg/dL (ref 100–199)
HDL: 91 mg/dL (ref >39)
LDL Chol Calc (NIH): 61 mg/dL (ref 0–99)
Triglycerides: 53 mg/dL (ref 0–149)
VLDL Cholesterol Cal: 11 mg/dL (ref 5–40)

## 2020-12-18 LAB — CARDIOVASCULAR RISK ASSESSMENT

## 2020-12-22 ENCOUNTER — Telehealth: Payer: Self-pay | Admitting: Family Medicine

## 2020-12-22 LAB — SPECIMEN STATUS REPORT

## 2020-12-22 LAB — HGB A1C W/O EAG: Hgb A1c MFr Bld: 5.3 % (ref 4.8–5.6)

## 2020-12-22 NOTE — Telephone Encounter (Signed)
   Walter Joseph has been scheduled for the following appointment:  WHAT: Vascular Ultrasound WHERE: [facility] DATE: 12/24/20 TIME: 2:30  Patient has been made aware.

## 2020-12-22 NOTE — Telephone Encounter (Signed)
error 

## 2020-12-23 ENCOUNTER — Other Ambulatory Visit: Payer: Self-pay | Admitting: Family Medicine

## 2020-12-28 DIAGNOSIS — I6523 Occlusion and stenosis of bilateral carotid arteries: Secondary | ICD-10-CM | POA: Diagnosis not present

## 2020-12-28 DIAGNOSIS — I10 Essential (primary) hypertension: Secondary | ICD-10-CM | POA: Diagnosis not present

## 2020-12-28 DIAGNOSIS — E782 Mixed hyperlipidemia: Secondary | ICD-10-CM | POA: Diagnosis not present

## 2020-12-28 DIAGNOSIS — I2510 Atherosclerotic heart disease of native coronary artery without angina pectoris: Secondary | ICD-10-CM | POA: Diagnosis not present

## 2021-01-11 DIAGNOSIS — H2513 Age-related nuclear cataract, bilateral: Secondary | ICD-10-CM | POA: Diagnosis not present

## 2021-01-18 ENCOUNTER — Other Ambulatory Visit: Payer: Self-pay | Admitting: Family Medicine

## 2021-03-08 ENCOUNTER — Other Ambulatory Visit: Payer: Self-pay | Admitting: Family Medicine

## 2021-04-08 ENCOUNTER — Other Ambulatory Visit: Payer: Self-pay | Admitting: Family Medicine

## 2021-06-10 ENCOUNTER — Other Ambulatory Visit: Payer: Self-pay | Admitting: Family Medicine

## 2021-06-13 NOTE — Telephone Encounter (Signed)
Refill sent to pharmacy.   

## 2021-06-29 ENCOUNTER — Other Ambulatory Visit: Payer: Self-pay

## 2021-06-29 ENCOUNTER — Ambulatory Visit (INDEPENDENT_AMBULATORY_CARE_PROVIDER_SITE_OTHER): Payer: Medicare PPO

## 2021-06-29 VITALS — BP 138/60 | HR 75 | Resp 16 | Ht 71.0 in | Wt 173.4 lb

## 2021-06-29 DIAGNOSIS — Z0000 Encounter for general adult medical examination without abnormal findings: Secondary | ICD-10-CM

## 2021-06-29 DIAGNOSIS — M545 Low back pain, unspecified: Secondary | ICD-10-CM | POA: Diagnosis not present

## 2021-06-29 DIAGNOSIS — Z23 Encounter for immunization: Secondary | ICD-10-CM | POA: Diagnosis not present

## 2021-06-29 MED ORDER — ZOSTER VAC RECOMB ADJUVANTED 50 MCG/0.5ML IM SUSR: 0.5000 mL | Freq: Once | INTRAMUSCULAR | 0 refills | Status: AC

## 2021-06-29 NOTE — Patient Instructions (Signed)

## 2021-06-29 NOTE — Progress Notes (Signed)
Subjective:   Walter Joseph is a 80 y.o. male who presents for Medicare Annual/Subsequent preventive examination.  This wellness visit is conducted by a nurse.  The patient's medications were reviewed and reconciled since the patient's last visit.  History details were provided by the patient.  The history appears to be reliable.    Patient's last AWV was one year ago.   Medical History: Patient history and Family history was reviewed  Medications, Allergies, and preventative health maintenance was reviewed and updated.  Walter Joseph also complains of back pain and is requesting a steroid dose pack.    Review of Systems    Review of Systems  Constitutional: Negative.   HENT: Negative.    Eyes: Negative.        Bilateral cataract  Respiratory: Negative.  Negative for cough and shortness of breath.   Cardiovascular: Negative.  Negative for chest pain and palpitations.  Gastrointestinal: Negative.   Genitourinary: Negative.   Musculoskeletal:  Positive for back pain.  Neurological: Negative.   Psychiatric/Behavioral: Negative.     Cardiac Risk Factors include: advanced age (>37men, >34 women);hypertension;male gender     Objective:    Today's Vitals   06/29/21 1453  BP: 138/60  Pulse: 75  Resp: 16  SpO2: 98%  Weight: 173 lb 6.4 oz (78.7 kg)  Height: 5\' 11"  (1.803 m)  PainSc: 7   PainLoc: Back   Body mass index is 24.18 kg/m.  Advanced Directives 06/29/2021 06/18/2020 06/18/2020  Does Patient Have a Medical Advance Directive? Yes - Yes  Type of Advance Directive [place];Living will (No Data) [place];Living will  Does patient want to make changes to medical advance directive? No - Patient declined - -  Copy of [place] in Chart? No - copy requested - -    Current Medications (verified) Outpatient Encounter Medications as of 06/29/2021  Medication Sig   amLODipine (NORVASC) 10 MG tablet TAKE ONE TABLET BY MOUTH EVERY  DAY   aspirin 81 MG EC tablet Take by mouth.   rosuvastatin (CRESTOR) 5 MG tablet TAKE ONE TABLET BY MOUTH AT BEDTIME FOR CHOLESTEROL   lisinopril-hydrochlorothiazide (ZESTORETIC) 20-25 MG tablet TAKE 1 TABLET BY MOUTH ONCE (1) DAILY (Patient not taking: Reported on 06/29/2021)   No facility-administered encounter medications on file as of 06/29/2021.    Allergies (verified) Penicillin g and Tetracycline   History: Past Medical History:  Diagnosis Date   Alcohol abuse    Atherosclerosis of aorta ([facility])    Cognitive communication deficit    Essential hypertension    Mixed hyperlipidemia    No past surgical history on file. Family History  Problem Relation Age of Onset   Stroke Mother    Heart failure Mother    Diabetes Mother    Stroke Father    Heart failure Father    Hypertension Father    Heart attack Father    Hyperlipidemia Other    Arrhythmia Other    Social History   Socioeconomic History   Marital status: Married   Number of children: 3  Occupational History   Occupation: retired  Tobacco Use   Smoking status: Former    Packs/day: 1.00    Types: Cigarettes    Quit date: 1994    Years since quitting: 28.8   Smokeless tobacco: Never  Substance and Sexual Activity   Alcohol use: Yes    Alcohol/week: 2.0 - 3.0 standard drinks    Types: 2 - 3  Cans of beer per week   Drug use: Never   Sexual activity: Not on file   Social Determinants of Health   Financial Resource Strain: Not on file  Food Insecurity: No Food Insecurity   Worried About Running Out of Food in the Last Year: Never true   Ran Out of Food in the Last Year: Never true  Transportation Needs: No Transportation Needs   Lack of Transportation (Medical): No   Lack of Transportation (Non-Medical): No  Physical Activity: Inactive   Days of Exercise per Week: 0 days   Minutes of Exercise per Session: 0 min  Stress: Not on file  Social Connections: Not on file    Tobacco Counseling Counseling  given: Patient has not used tobacco products in >20 years   Clinical Intake:  Pre-visit preparation completed: Yes Pain : 0-10 Pain Score: 7  Pain Type: Acute pain Pain Location: Back Pain Descriptors / Indicators: Aching Pain Onset: Other (comment) (same pain as in 2019 - see MRI from [facility] in [facility] Everywhere) Pain Frequency: Intermittent   BMI - recorded: 24.18 Nutritional Status: BMI of 19-24  Normal Nutritional Risks: None Diabetes: No How often do you need to have someone help you when you read instructions, pamphlets, or other written materials from your doctor or pharmacy?: 1 - Never Interpreter Needed?: No    Activities of Daily Living In your present state of health, do you have any difficulty performing the following activities: 06/29/2021 12/17/2020  Hearing? N N  Vision? N N  Difficulty concentrating or making decisions? N N  Walking or climbing stairs? Y N  Comment acute back pain -  Dressing or bathing? N N  Doing errands, shopping? N N  Preparing Food and eating ? N -  Using the Toilet? N -  In the past six months, have you accidently leaked urine? N -  Do you have problems with loss of bowel control? N -  Managing your Medications? N -  Managing your Finances? N -  Housekeeping or managing your Housekeeping? N -  Some recent data might be hidden    Patient Care Team: Walter Brome, MD as PCP - General (Family Medicine) Alen Blew, OD (Optometry)    Assessment:   This is a routine wellness examination for Walter Joseph.  Hearing/Vision screen No results found.  Dietary issues and exercise activities discussed: Current Exercise Habits: The patient does not participate in regular exercise at present (however is active around the home), Exercise limited by: None identified  Depression Screen PHQ 2/9 Scores 06/29/2021 12/17/2020 06/18/2020 12/19/2019  PHQ - 2 Score 0 0 0 0    Fall Risk Fall Risk  06/29/2021 12/17/2020 06/18/2020 12/19/2019 12/19/2019  Falls  in the past year? 0 0 0 0 0  Number falls in past yr: 0 0 0 - 0  Injury with Fall? 0 0 0 0 0  Risk for fall due to : History of fall(s) No Fall Risks Impaired vision - -  Follow up Falls evaluation completed;Falls prevention discussed Falls evaluation completed Falls evaluation completed Falls evaluation completed -    FALL RISK PREVENTION PERTAINING TO THE HOME:  Home free of loose throw rugs in walkways, pet beds, electrical cords, etc? Yes  Adequate lighting in your home to reduce risk of falls? Yes   ASSISTIVE DEVICES UTILIZED TO PREVENT FALLS:  Joseph alert? No  Use of a cane, walker or w/c? No   Gait steady and fast without use of assistive device  Cognitive  Function:     6CIT Screen 06/29/2021  What Year? 0 points  What month? 0 points  What time? 0 points  Count back from 20 0 points  Months in reverse 0 points  Repeat phrase 0 points  Total Score 0    Immunizations Immunization History  Administered Date(s) Administered   Fluad Quad(high Dose 65+) 06/18/2020   PFIZER(Purple Top)SARS-COV-2 Vaccination 09/04/2019, 09/25/2019, 05/28/2020   Pfizer Covid-19 Vaccine Bivalent Booster 47yrs & up 05/31/2021   Pneumococcal Polysaccharide-23 07/02/2020    TDAP status: Due, Education has been provided regarding the importance of this vaccine. Advised may receive this vaccine at local pharmacy or Health Dept. Aware to provide a copy of the vaccination record if obtained from local pharmacy or Health Dept. Verbalized acceptance and understanding.  Flu Vaccine status: Due, Education has been provided regarding the importance of this vaccine. Advised may receive this vaccine at local pharmacy or Health Dept. Aware to provide a copy of the vaccination record if obtained from local pharmacy or Health Dept. Verbalized acceptance and understanding.  Pneumococcal vaccine status: Up to date  Covid-19 vaccine status: Completed vaccines  Qualifies for Shingles Vaccine? Yes   Zostavax  completed No   Shingrix Completed?: No.    Education has been provided regarding the importance of this vaccine. Patient has been advised to call insurance company to determine out of pocket expense if they have not yet received this vaccine. Advised may also receive vaccine at local pharmacy or Health Dept. Verbalized acceptance and understanding.  Screening Tests Health Maintenance  Topic Date Due   TETANUS/TDAP  Never done   Zoster Vaccines- Shingrix (1 of 2) Never done   INFLUENZA VACCINE  03/21/2021   Pneumonia Vaccine 7+ Years old (2 - PCV) 07/02/2021   COVID-19 Vaccine  Completed   HPV VACCINES  Aged Out    Health Maintenance  Health Maintenance Due  Topic Date Due   TETANUS/TDAP  Never done   Zoster Vaccines- Shingrix (1 of 2) Never done   INFLUENZA VACCINE  03/21/2021   Pneumonia Vaccine 10+ Years old (2 - PCV) 07/02/2021    Colorectal cancer screening: No longer required.   Lung Cancer Screening: (Low Dose CT Chest recommended if Age 57-80 years, 30 pack-year currently smoking OR have quit w/in 15years.) does not qualify.   Additional Screening:  Vision Screening: Recommended annual ophthalmology exams for early detection of glaucoma and other disorders of the eye. Is the patient up to date with their annual eye exam?  Yes  Who is the provider or what is the name of the office in which the patient attends annual eye exams? [place] Screening: Recommended annual dental exams for proper oral hygiene    Plan:    1- Appointment made with NP for back pain - patient is requesting steroid dose pack.  He wants to feel better for his grandson's wedding next week.  He has h/o backpain and had an MRI at [facility] in 2019.     He denies pain until recently.  It is gradually getting worse.  He saw Dr Lorretta Harp in the past. 2- Flu Vaccine and Shingrix Vaccine recommended - patient wants to wait until he is treated for his back pain 3- Bilateral Cataracts - plans to have  removed in the future   I have personally reviewed and noted the following in the patient's chart:   Medical and social history Use of alcohol, tobacco or illicit drugs  Current medications and supplements  including opioid prescriptions. Patient is not currently taking opioid prescriptions. Functional ability and status Nutritional status Physical activity Advanced directives List of other physicians Hospitalizations, surgeries, and ER visits in previous 12 months Vitals Screenings to include cognitive, depression, and falls Referrals and appointments  In addition, I have reviewed and discussed with patient certain preventive protocols, quality metrics, and best practice recommendations. A written personalized care plan for preventive services as well as general preventive health recommendations were provided to patient.     Erie Noe, LPN   85/04/930

## 2021-06-30 ENCOUNTER — Ambulatory Visit: Payer: Medicare PPO | Admitting: Nurse Practitioner

## 2021-06-30 ENCOUNTER — Encounter: Payer: Self-pay | Admitting: Nurse Practitioner

## 2021-06-30 VITALS — BP 132/70 | Temp 97.4°F | Ht 71.0 in | Wt 174.0 lb

## 2021-06-30 DIAGNOSIS — M545 Low back pain, unspecified: Secondary | ICD-10-CM

## 2021-06-30 MED ORDER — PREDNISONE 20 MG PO TABS: 20.0000 mg | ORAL_TABLET | Freq: Every day | ORAL | 0 refills | Status: DC

## 2021-06-30 NOTE — Progress Notes (Signed)
 Acute Office Visit  Subjective:    Patient ID: Walter Joseph, male    DOB: 10/11/1940, 80 y.o.   MRN: [id number]  Chief Complaint  Patient presents with   Back Pain    HPI: Walter Joseph is an 80-year-old Caucasian male that presents with recurrent lower back pain. Onset was 2-weeks ago.   Back Pain  He reports chronic back pain. There was not an injury that may have caused the pain. The most recent episode started a few weeks ago and is gradually worsening. The pain is located in the mid lower backwithout radiation. It is described as aching, is moderate in intensity, occurring constantly. Aggravating factors: moving, walking Relieving factors: none.  He has tried oral steroids with significant relief in years past. States this has been a ongoing issue for the past 3 years. States he is motivated to improve back pain quickly to attend his grandson's wedding next week in [place], .   Associated symptoms: No abdominal pain No bowel incontinence  No chest pain No dysuria   No fever No headaches  No joint pains No pelvic pain  No weakness in leg  No tingling in lower extremities  No urinary incontinence No weight loss     Past Medical History:  Diagnosis Date   Alcohol abuse    Atherosclerosis of aorta (HCC)    Cognitive communication deficit    Essential hypertension    Mixed hyperlipidemia     No past surgical history on file.  Family History  Problem Relation Age of Onset   Stroke Mother    Heart failure Mother    Diabetes Mother    Stroke Father    Heart failure Father    Hypertension Father    Heart attack Father    Hyperlipidemia Other    Arrhythmia Other     Social History   Socioeconomic History   Marital status: Married    Spouse name: Not on file   Number of children: 3   Years of education: Not on file   Highest education level: Not on file  Occupational History   Occupation: retired  Tobacco Use   Smoking status: Former    Packs/day: 1.00     Types: Cigarettes    Quit date: 1994    Years since quitting: 28.8   Smokeless tobacco: Never  Substance and Sexual Activity   Alcohol use: Yes    Alcohol/week: 2.0 - 3.0 standard drinks    Types: 2 - 3 Cans of beer per week   Drug use: Never   Sexual activity: Not on file  Other Topics Concern   Not on file  Social History Narrative   Not on file   Social Determinants of Health   Financial Resource Strain: Not on file  Food Insecurity: No Food Insecurity   Worried About Running Out of Food in the Last Year: Never true   Ran Out of Food in the Last Year: Never true  Transportation Needs: No Transportation Needs   Lack of Transportation (Medical): No   Lack of Transportation (Non-Medical): No  Physical Activity: Inactive   Days of Exercise per Week: 0 days   Minutes of Exercise per Session: 0 min  Stress: Not on file  Social Connections: Not on file  Intimate Partner Violence: Not At Risk   Fear of Current or Ex-Partner: No   Emotionally Abused: No   Physically Abused: No   Sexually Abused: No    Outpatient Medications Prior to Visit    Medication Sig Dispense Refill   amLODipine (NORVASC) 10 MG tablet TAKE ONE TABLET BY MOUTH EVERY DAY 90 tablet 1   aspirin 81 MG EC tablet Take by mouth.     lisinopril-hydrochlorothiazide (ZESTORETIC) 20-25 MG tablet TAKE 1 TABLET BY MOUTH ONCE (1) DAILY (Patient not taking: Reported on 06/29/2021) 90 tablet 1   rosuvastatin (CRESTOR) 5 MG tablet TAKE ONE TABLET BY MOUTH AT BEDTIME FOR CHOLESTEROL 90 tablet 1   No facility-administered medications prior to visit.    Allergies  Allergen Reactions   Penicillin G Anaphylaxis   Tetracycline Hives    Review of Systems  Constitutional:  Negative for chills, diaphoresis, fatigue and fever.  HENT:  Negative for congestion, ear pain and sore throat.   Respiratory:  Negative for cough and shortness of breath.   Cardiovascular:  Negative for chest pain.  Gastrointestinal:  Negative for  constipation.  Endocrine: Negative.   Genitourinary:  Negative for decreased urine volume, difficulty urinating, dysuria, flank pain, frequency, hematuria and urgency.  Musculoskeletal:  Positive for back pain. Negative for arthralgias and myalgias.  Skin: Negative.   Hematological: Negative.   Psychiatric/Behavioral: Negative.        Objective:    Physical Exam Vitals reviewed.  Constitutional:      Appearance: Normal appearance.  Cardiovascular:     Rate and Rhythm: Normal rate and regular rhythm.     Pulses: Normal pulses.     Heart sounds: Normal heart sounds.  Pulmonary:     Effort: Pulmonary effort is normal.     Breath sounds: Normal breath sounds.  Abdominal:     General: Bowel sounds are normal.     Palpations: Abdomen is soft.  Musculoskeletal:        General: Tenderness present.     Cervical back: Normal.     Thoracic back: Normal.     Lumbar back: Tenderness and bony tenderness present. No swelling, edema, deformity, signs of trauma or spasms. Decreased range of motion. Negative right straight leg raise test and negative left straight leg raise test.  Skin:    General: Skin is warm and dry.     Capillary Refill: Capillary refill takes less than 2 seconds.  Neurological:     General: No focal deficit present.     Mental Status: He is alert and oriented to person, place, and time.  Psychiatric:        Mood and Affect: Mood normal.        Behavior: Behavior normal.    Temp (!) 97.4 F (36.3 C)   Ht 5' 11" (1.803 m)   Wt 174 lb (78.9 kg)   SpO2 98%   BMI 24.27 kg/m  BP 132/70   Temp (!) 97.4 F (36.3 C)   Ht 5' 11" (1.803 m)   Wt 174 lb (78.9 kg)   SpO2 98%   BMI 24.27 kg/m   Wt Readings from Last 3 Encounters:  06/30/21 174 lb (78.9 kg)  06/29/21 173 lb 6.4 oz (78.7 kg)  12/17/20 177 lb (80.3 kg)    Health Maintenance Due  Topic Date Due   TETANUS/TDAP  Never done   Zoster Vaccines- Shingrix (1 of 2) Never done   INFLUENZA VACCINE  03/21/2021    Pneumonia Vaccine 65+ Years old (2 - PCV) 07/02/2021    Lab Results  Component Value Date   WBC 7.2 12/17/2020   HGB 14.0 12/17/2020   HCT 40.3 12/17/2020   MCV 92 12/17/2020   PLT 186   12/17/2020   Lab Results  Component Value Date   NA 128 (L) 12/17/2020   K 4.2 12/17/2020   CO2 22 12/17/2020   GLUCOSE 107 (H) 12/17/2020   BUN 10 12/17/2020   CREATININE 0.95 12/17/2020   BILITOT 0.7 12/17/2020   ALKPHOS 70 12/17/2020   AST 26 12/17/2020   ALT 28 12/17/2020   PROT 7.6 12/17/2020   ALBUMIN 4.6 12/17/2020   CALCIUM 9.7 12/17/2020   EGFR 81 12/17/2020   Lab Results  Component Value Date   CHOL 163 12/17/2020   Lab Results  Component Value Date   HDL 91 12/17/2020   Lab Results  Component Value Date   LDLCALC 61 12/17/2020   Lab Results  Component Value Date   TRIG 53 12/17/2020   Lab Results  Component Value Date   CHOLHDL 1.8 12/17/2020   Lab Results  Component Value Date   HGBA1C 5.3 12/17/2020       Assessment & Plan:    1. Recurrent low back pain - predniSONE (DELTASONE) 20 MG tablet; Take 1 tablet (20 mg total) by mouth daily with breakfast. 1 po tid for 3 days then 1 po bid for 3 days then 1 po qd for 3 days  Dispense: 18 tablet; Refill: 0    Continue Aleve as directed for back pain Alternate heat and ice to lower back Take Prednisone taper as directed Perform low back exercises daily Follow-up as needed    Follow-up: PRN  An After Visit Summary was printed and given to the patient.  I, Shannon J Heaton, NP, have reviewed all documentation for this visit. The documentation on 06/30/21 for the exam, diagnosis, procedures, and orders are all accurate and complete.   Shannon J Heaton, NP [facility] [phone]  

## 2021-06-30 NOTE — Patient Instructions (Addendum)
Continue Aleve as directed for back pain Alternate heat and ice to lower back Take Prednisone taper as directed Perform low back exercises daily Follow-up as needed  Acute Back Pain, Adult Acute back pain is sudden and usually short-lived. It is often caused by an injury to the muscles and tissues in the back. The injury may result from: A muscle, tendon, or ligament getting overstretched or torn. Ligaments are tissues that connect bones to each other. Lifting something improperly can cause a back strain. Wear and tear (degeneration) of the spinal disks. Spinal disks are circular tissue that provide cushioning between the bones of the spine (vertebrae). Twisting motions, such as while playing sports or doing yard work. A hit to the back. Arthritis. You may have a physical exam, lab tests, and imaging tests to find the cause of your pain. Acute back pain usually goes away with rest and home care. Follow these instructions at home: Managing pain, stiffness, and swelling Take over-the-counter and prescription medicines only as told by your health care provider. Treatment may include medicines for pain and inflammation that are taken by mouth or applied to the skin, or muscle relaxants. Your health care provider may recommend applying ice during the first 24-48 hours after your pain starts. To do this: Put ice in a plastic bag. Place a towel between your skin and the bag. Leave the ice on for 20 minutes, 2-3 times a day. Remove the ice if your skin turns bright red. This is very important. If you cannot feel pain, heat, or cold, you have a greater risk of damage to the area. If directed, apply heat to the affected area as often as told by your health care provider. Use the heat source that your health care provider recommends, such as a moist heat pack or a heating pad. Place a towel between your skin and the heat source. Leave the heat on for 20-30 minutes. Remove the heat if your skin turns  bright red. This is especially important if you are unable to feel pain, heat, or cold. You have a greater risk of getting burned. Activity  Do not stay in bed. Staying in bed for more than 1-2 days can delay your recovery. Sit up and stand up straight. Avoid leaning forward when you sit or hunching over when you stand. If you work at a desk, sit close to it so you do not need to lean over. Keep your chin tucked in. Keep your neck drawn back, and keep your elbows bent at a 90-degree angle (right angle). Sit high and close to the steering wheel when you drive. Add lower back (lumbar) support to your car seat, if needed. Take short walks on even surfaces as soon as you are able. Try to increase the length of time you walk each day. Do not sit, drive, or stand in one place for more than 30 minutes at a time. Sitting or standing for long periods of time can put stress on your back. Do not drive or use heavy machinery while taking prescription pain medicine. Use proper lifting techniques. When you bend and lift, use positions that put less stress on your back: Emelle your knees. Keep the load close to your body. Avoid twisting. Exercise regularly as told by your health care provider. Exercising helps your back heal faster and helps prevent back injuries by keeping muscles strong and flexible. Work with a physical therapist to make a safe exercise program, as recommended by your health care provider.  Do any exercises as told by your physical therapist. Lifestyle Maintain a healthy weight. Extra weight puts stress on your back and makes it difficult to have good posture. Avoid activities or situations that make you feel anxious or stressed. Stress and anxiety increase muscle tension and can make back pain worse. Learn ways to manage anxiety and stress, such as through exercise. General instructions Sleep on a firm mattress in a comfortable position. Try lying on your side with your knees slightly bent. If  you lie on your back, put a pillow under your knees. Keep your head and neck in a straight line with your spine (neutral position) when using electronic equipment like smartphones or pads. To do this: Raise your smartphone or pad to look at it instead of bending your head or neck to look down. Put the smartphone or pad at the level of your face while looking at the screen. Follow your treatment plan as told by your health care provider. This may include: Cognitive or behavioral therapy. Acupuncture or massage therapy. Meditation or yoga. Contact a health care provider if: You have pain that is not relieved with rest or medicine. You have increasing pain going down into your legs or buttocks. Your pain does not improve after 2 weeks. You have pain at night. You lose weight without trying. You have a fever or chills. You develop nausea or vomiting. You develop abdominal pain. Get help right away if: You develop new bowel or bladder control problems. You have unusual weakness or numbness in your arms or legs. You feel faint. These symptoms may represent a serious problem that is an emergency. Do not wait to see if the symptoms will go away. Get medical help right away. Call your local emergency services (911 in the [place] Do not drive yourself to the hospital. Summary Acute back pain is sudden and usually short-lived. Use proper lifting techniques. When you bend and lift, use positions that put less stress on your back. Take over-the-counter and prescription medicines only as told by your health care provider, and apply heat or ice as told. This information is not intended to replace advice given to you by your health care provider. Make sure you discuss any questions you have with your health care provider. Document Revised: 10/29/2020 Document Reviewed: 10/29/2020 Elsevier Patient Education  [place]. Back Exercises These exercises help to make your trunk and back strong. They  also help to keep the lower back flexible. Doing these exercises can help to prevent or lessen pain in your lower back. If you have back pain, try to do these exercises 2-3 times each day or as told by your doctor. As you get better, do the exercises once each day. Repeat the exercises more often as told by your doctor. To stop back pain from coming back, do the exercises once each day, or as told by your doctor. Do exercises exactly as told by your doctor. Stop right away if you feel sudden pain or your pain gets worse. Exercises Single knee to chest Do these steps 3-5 times in a row for each leg: Lie on your back on a firm bed or the floor with your legs stretched out. Bring one knee to your chest. Grab your knee or thigh with both hands and hold it in place. Pull on your knee until you feel a gentle stretch in your lower back or butt. Keep doing the stretch for 10-30 seconds. Slowly let go of your leg and straighten  it. Pelvic tilt Do these steps 5-10 times in a row: Lie on your back on a firm bed or the floor with your legs stretched out. Bend your knees so they point up to the ceiling. Your feet should be flat on the floor. Tighten your lower belly (abdomen) muscles to press your lower back against the floor. This will make your tailbone point up to the ceiling instead of pointing down to your feet or the floor. Stay in this position for 5-10 seconds while you gently tighten your muscles and breathe evenly. Cat-cow Do these steps until your lower back bends more easily: Get on your hands and knees on a firm bed or the floor. Keep your hands under your shoulders, and keep your knees under your hips. You may put padding under your knees. Let your head hang down toward your chest. Tighten (contract) the muscles in your belly. Point your tailbone toward the floor so your lower back becomes rounded like the back of a cat. Stay in this position for 5 seconds. Slowly lift your head. Let the  muscles of your belly relax. Point your tailbone up toward the ceiling so your back forms a sagging arch like the back of a cow. Stay in this position for 5 seconds.  Press-ups Do these steps 5-10 times in a row: Lie on your belly (face-down) on a firm bed or the floor. Place your hands near your head, about shoulder-width apart. While you keep your back relaxed and keep your hips on the floor, slowly straighten your arms to raise the top half of your body and lift your shoulders. Do not use your back muscles. You may change where you place your hands to make yourself more comfortable. Stay in this position for 5 seconds. Keep your back relaxed. Slowly return to lying flat on the floor.  Bridges Do these steps 10 times in a row: Lie on your back on a firm bed or the floor. Bend your knees so they point up to the ceiling. Your feet should be flat on the floor. Your arms should be flat at your sides, next to your body. Tighten your butt muscles and lift your butt off the floor until your waist is almost as high as your knees. If you do not feel the muscles working in your butt and the back of your thighs, slide your feet 1-2 inches (2.5-5 cm) farther away from your butt. Stay in this position for 3-5 seconds. Slowly lower your butt to the floor, and let your butt muscles relax. If this exercise is too easy, try doing it with your arms crossed over your chest. Belly crunches Do these steps 5-10 times in a row: Lie on your back on a firm bed or the floor with your legs stretched out. Bend your knees so they point up to the ceiling. Your feet should be flat on the floor. Cross your arms over your chest. Tip your chin a little bit toward your chest, but do not bend your neck. Tighten your belly muscles and slowly raise your chest just enough to lift your shoulder blades a tiny bit off the floor. Avoid raising your body higher than that because it can put too much stress on your lower back. Slowly  lower your chest and your head to the floor. Back lifts Do these steps 5-10 times in a row: Lie on your belly (face-down) with your arms at your sides, and rest your forehead on the floor. Tighten the muscles  in your legs and your butt. Slowly lift your chest off the floor while you keep your hips on the floor. Keep the back of your head in line with the curve in your back. Look at the floor while you do this. Stay in this position for 3-5 seconds. Slowly lower your chest and your face to the floor. Contact a doctor if: Your back pain gets a lot worse when you do an exercise. Your back pain does not get better within 2 hours after you exercise. If you have any of these problems, stop doing the exercises. Do not do them again unless your doctor says it is okay. Get help right away if: You have sudden, very bad back pain. If this happens, stop doing the exercises. Do not do them again unless your doctor says it is okay. This information is not intended to replace advice given to you by your health care provider. Make sure you discuss any questions you have with your health care provider. Document Revised: 10/20/2020 Document Reviewed: 10/20/2020 Elsevier Patient Education  [place].

## 2021-08-09 ENCOUNTER — Other Ambulatory Visit: Payer: Self-pay

## 2021-08-09 ENCOUNTER — Telehealth: Payer: Medicare PPO | Admitting: Legal Medicine

## 2021-08-11 ENCOUNTER — Telehealth (INDEPENDENT_AMBULATORY_CARE_PROVIDER_SITE_OTHER): Payer: Medicare PPO | Admitting: Family Medicine

## 2021-08-11 ENCOUNTER — Encounter: Payer: Self-pay | Admitting: Family Medicine

## 2021-08-11 DIAGNOSIS — U071 COVID-19: Secondary | ICD-10-CM | POA: Diagnosis not present

## 2021-08-11 DIAGNOSIS — J069 Acute upper respiratory infection, unspecified: Secondary | ICD-10-CM | POA: Diagnosis not present

## 2021-08-11 MED ORDER — MOLNUPIRAVIR EUA 200MG CAPSULE: 4.0000 | ORAL_CAPSULE | Freq: Two times a day (BID) | ORAL | 0 refills | Status: AC

## 2021-08-11 NOTE — Progress Notes (Signed)
Virtual Visit via Video Note   This visit type was conducted due to national recommendations for restrictions regarding the COVID-19 Pandemic (e.g. social distancing) in an effort to limit this patient's exposure and mitigate transmission in our community.  Due to his co-morbid illnesses, this patient is at least at moderate risk for complications without adequate follow up.  This format is felt to be most appropriate for this patient at this time.  All issues noted in this document were discussed and addressed.  A limited physical exam was performed with this format.  A verbal consent was obtained for the virtual visit.   Date:  09/04/2021   ID:  Walter Joseph, DOB 05/10/1941, MRN [id number]  Patient Location: Home Provider Location: Office/Clinic  PCP:  Rochel Brome, MD   Evaluation Performed:  acute visit  Chief Complaint:  fever  History of Present Illness:    Walter Joseph is a 80 y.o. male complaining of fever, cough, and congestion that started Tuesday. Patient tested for Covid and was positive for Covid-19. The patient does have symptoms concerning for COVID-19 infection (fever, chills, cough, but no shortness of breath).    Past Medical History:  Diagnosis Date   Alcohol abuse    Atherosclerosis of aorta ([place])    Cognitive communication deficit    Essential hypertension    Mixed hyperlipidemia     History reviewed. No pertinent surgical history.  Family History  Problem Relation Age of Onset   Stroke Mother    Heart failure Mother    Diabetes Mother    Stroke Father    Heart failure Father    Hypertension Father    Heart attack Father    Hyperlipidemia Other    Arrhythmia Other     Social History   Socioeconomic History   Marital status: Married    Spouse name: Not on file   Number of children: 3   Years of education: Not on file   Highest education level: Not on file  Occupational History   Occupation: retired  Tobacco Use   Smoking status: Former     Packs/day: 1.00    Types: Cigarettes    Quit date: 1994    Years since quitting: 29.0   Smokeless tobacco: Never  Substance and Sexual Activity   Alcohol use: Yes    Alcohol/week: 2.0 - 3.0 standard drinks    Types: 2 - 3 Cans of beer per week   Drug use: Never   Sexual activity: Not on file  Other Topics Concern   Not on file  Social History Narrative   Not on file   Social Determinants of Health   Financial Resource Strain: Not on file  Food Insecurity: No Food Insecurity   Worried About Running Out of Food in the Last Year: Never true   Ran Out of Food in the Last Year: Never true  Transportation Needs: No Transportation Needs   Lack of Transportation (Medical): No   Lack of Transportation (Non-Medical): No  Physical Activity: Inactive   Days of Exercise per Week: 0 days   Minutes of Exercise per Session: 0 min  Stress: Not on file  Social Connections: Not on file  Intimate Partner Violence: Not At Risk   Fear of Current or Ex-Partner: No   Emotionally Abused: No   Physically Abused: No   Sexually Abused: No    Outpatient Medications Prior to Visit  Medication Sig Dispense Refill   amLODipine (NORVASC) 10 MG tablet TAKE ONE TABLET  BY MOUTH EVERY DAY 90 tablet 1   aspirin 81 MG EC tablet Take by mouth.     lisinopril-hydrochlorothiazide (ZESTORETIC) 20-25 MG tablet TAKE 1 TABLET BY MOUTH ONCE (1) DAILY 90 tablet 1   rosuvastatin (CRESTOR) 5 MG tablet TAKE ONE TABLET BY MOUTH AT BEDTIME FOR CHOLESTEROL 90 tablet 1   predniSONE (DELTASONE) 20 MG tablet Take 1 tablet (20 mg total) by mouth daily with breakfast. 1 po tid for 3 days then 1 po bid for 3 days then 1 po qd for 3 days 18 tablet 0   No facility-administered medications prior to visit.    Allergies:   Penicillin g and Tetracycline   Social History   Tobacco Use   Smoking status: Former    Packs/day: 1.00    Types: Cigarettes    Quit date: 1994    Years since quitting: 29.0   Smokeless tobacco: Never   Substance Use Topics   Alcohol use: Yes    Alcohol/week: 2.0 - 3.0 standard drinks    Types: 2 - 3 Cans of beer per week   Drug use: Never     Review of Systems  Constitutional:  Positive for fever and malaise/fatigue.  HENT:  Positive for congestion. Negative for ear pain and sore throat.   Respiratory:  Positive for cough. Negative for shortness of breath.   Cardiovascular:  Negative for chest pain and palpitations.  Gastrointestinal:  Negative for abdominal pain, diarrhea, nausea and vomiting.  Neurological:  Negative for headaches.    Labs/Other Tests and Data Reviewed:    Recent Labs: 12/17/2020: ALT 28; BUN 10; Creatinine, Ser 0.95; Hemoglobin 14.0; Platelets 186; Potassium 4.2; Sodium 128   Recent Lipid Panel Lab Results  Component Value Date/Time   CHOL 163 12/17/2020 08:42 AM   TRIG 53 12/17/2020 08:42 AM   HDL 91 12/17/2020 08:42 AM   CHOLHDL 1.8 12/17/2020 08:42 AM   LDLCALC 61 12/17/2020 08:42 AM    Wt Readings from Last 3 Encounters:  06/30/21 174 lb (78.9 kg)  06/29/21 173 lb 6.4 oz (78.7 kg)  12/17/20 177 lb (80.3 kg)     Objective:    Vital Signs:  BP 133/68    Pulse 72    Temp 100 F (37.8 C)    Physical Exam  No exam done. No vs available.   ASSESSMENT & PLAN:    Problem List Items Addressed This Visit       Respiratory   Upper respiratory tract infection due to COVID-19 virus    Start on molnupirivir Rest, fluids, eat, cough medicine, tylenol or ibuprofen. Your COVID test is positive. You should remain isolated and quarantine for at least 5 days from start of symptoms. You must be feeling better and be fever free without any fever reducers for at least 24 hours as well. You should wear a mask at all times when out of your home or around others for 5 days after leaving isolation.  Your household contacts should be tested as well as work contacts. If you feel worse or have increasing shortness of breath, you should be seen in person at urgent  care or the emergency room.          COVID-19 Education: The signs and symptoms of COVID-19 were discussed with the patient and how to seek care for testing (follow up with PCP or arrange E-visit). The importance of social distancing was discussed today.   I spent 8 minutes dedicated to the care of this  patient on the date of this encounter to include face-to-face time with the patient.  Follow Up:  In Person prn  Signed, Rochel Brome, MD  09/04/2021 3:46 PM    [place]

## 2021-08-11 NOTE — Assessment & Plan Note (Signed)
Start on molnupirivir Rest, fluids, eat, cough medicine, tylenol or ibuprofen. Your COVID test is positive. You should remain isolated and quarantine for at least 5 days from start of symptoms. You must be feeling better and be fever free without any fever reducers for at least 24 hours as well. You should wear a mask at all times when out of your home or around others for 5 days after leaving isolation.  Your household contacts should be tested as well as work contacts. If you feel worse or have increasing shortness of breath, you should be seen in person at urgent care or the emergency room.

## 2021-09-20 ENCOUNTER — Other Ambulatory Visit: Payer: Self-pay | Admitting: Family Medicine

## 2021-12-22 ENCOUNTER — Ambulatory Visit: Payer: Medicare PPO | Admitting: Family Medicine

## 2021-12-27 ENCOUNTER — Other Ambulatory Visit: Payer: Self-pay | Admitting: Family Medicine

## 2021-12-27 NOTE — Telephone Encounter (Signed)
Refill sent to pharmacy.   

## 2022-01-03 DIAGNOSIS — H25813 Combined forms of age-related cataract, bilateral: Secondary | ICD-10-CM | POA: Diagnosis not present

## 2022-01-17 DIAGNOSIS — H52223 Regular astigmatism, bilateral: Secondary | ICD-10-CM | POA: Diagnosis not present

## 2022-01-17 DIAGNOSIS — Z87891 Personal history of nicotine dependence: Secondary | ICD-10-CM | POA: Diagnosis not present

## 2022-01-17 DIAGNOSIS — H25811 Combined forms of age-related cataract, right eye: Secondary | ICD-10-CM | POA: Diagnosis not present

## 2022-01-17 HISTORY — PX: CATARACT EXTRACTION: SUR2

## 2022-01-25 ENCOUNTER — Encounter: Payer: Self-pay | Admitting: Family Medicine

## 2022-01-25 ENCOUNTER — Ambulatory Visit: Payer: Medicare PPO | Admitting: Family Medicine

## 2022-01-25 VITALS — BP 138/70 | HR 78 | Temp 97.3°F | Resp 16 | Ht 71.0 in | Wt 169.2 lb

## 2022-01-25 DIAGNOSIS — I6523 Occlusion and stenosis of bilateral carotid arteries: Secondary | ICD-10-CM

## 2022-01-25 DIAGNOSIS — I700 Atherosclerosis of aorta: Secondary | ICD-10-CM

## 2022-01-25 DIAGNOSIS — I10 Essential (primary) hypertension: Secondary | ICD-10-CM | POA: Diagnosis not present

## 2022-01-25 DIAGNOSIS — E782 Mixed hyperlipidemia: Secondary | ICD-10-CM

## 2022-01-25 MED ORDER — SHINGRIX 50 MCG/0.5ML IM SUSR: 0.5000 mL | Freq: Once | INTRAMUSCULAR | 0 refills | Status: AC

## 2022-01-25 MED ORDER — TETANUS-DIPHTH-ACELL PERTUSSIS 5-2-15.5 LF-MCG/0.5 IM SUSP: 0.5000 mL | Freq: Once | INTRAMUSCULAR | 0 refills | Status: AC

## 2022-01-25 NOTE — Patient Instructions (Signed)
TDAP (Tetanus and pertussis)  Shingrix vaccines

## 2022-01-25 NOTE — Progress Notes (Signed)
Subjective:  Patient ID: Walter Joseph, male    DOB: July 18, 1941  Age: 81 y.o. MRN: [id number]  Chief Complaint  Patient presents with   Hypertension   HPI: Essential hypertension Amlodipine 10 mg daily. Had to stop Lisinopril-hydrochlorothiazide due to low blood pressures. BP 120-130/70s. Eating pretty healthy. Exercising.   Mixed hyperlipidemia/Carotid atherosclerosis/Atherosclerosis of aorta Crestor 5 mg once daily.  Aspirin 81 mg daily tolerating all medications.  Tolerating the medicine.  Carotid Dopplers done May 2022 less than 50%.  Patient is also been noted to have aortic atherosclerosis.  Eating fairly healthy.  Exercises some.    Current Outpatient Medications on File Prior to Visit  Medication Sig Dispense Refill   amLODipine (NORVASC) 10 MG tablet TAKE ONE TABLET BY MOUTH EVERY DAY 90 tablet 1   aspirin 81 MG EC tablet Take by mouth.     rosuvastatin (CRESTOR) 5 MG tablet TAKE ONE TABLET BY MOUTH AT BEDTIME FOR CHOLESTEROL 90 tablet 1   No current facility-administered medications on file prior to visit.   Past Medical History:  Diagnosis Date   Alcohol abuse    Atherosclerosis of aorta ([facility])    Cognitive communication deficit    Essential hypertension    Mixed hyperlipidemia    Past Surgical History:  Procedure Laterality Date   CATARACT EXTRACTION Right 01/17/2022    Family History  Problem Relation Age of Onset   Stroke Mother    Heart failure Mother    Diabetes Mother    Stroke Father    Heart failure Father    Hypertension Father    Heart attack Father    Hyperlipidemia Other    Arrhythmia Other    Social History   Socioeconomic History   Marital status: Married    Spouse name: Not on file   Number of children: 3   Years of education: Not on file   Highest education level: Not on file  Occupational History   Occupation: retired  Tobacco Use   Smoking status: Former    Packs/day: 1.00    Types: Cigarettes    Quit date: 1994    Years since  quitting: 29.4   Smokeless tobacco: Never  Substance and Sexual Activity   Alcohol use: Yes    Alcohol/week: 2.0 - 3.0 standard drinks of alcohol    Types: 2 - 3 Cans of beer per week   Drug use: Never   Sexual activity: Not on file  Other Topics Concern   Not on file  Social History Narrative   Not on file   Social Determinants of Health   Financial Resource Strain: Not on file  Food Insecurity: No Food Insecurity (06/29/2021)   Hunger Vital Sign    Worried About Running Out of Food in the Last Year: Never true    Ran Out of Food in the Last Year: Never true  Transportation Needs: No Transportation Needs (06/29/2021)   PRAPARE - Hydrologist (Medical): No    Lack of Transportation (Non-Medical): No  Physical Activity: Inactive (06/29/2021)   Exercise Vital Sign    Days of Exercise per Week: 0 days    Minutes of Exercise per Session: 0 min  Stress: Not on file  Social Connections: Not on file    Review of Systems  Constitutional:  Negative for chills, fatigue and fever.  HENT:  Negative for congestion, ear pain and sore throat.   Respiratory:  Negative for cough and shortness of breath.  Cardiovascular:  Negative for chest pain.  Gastrointestinal:  Negative for abdominal pain, constipation, diarrhea, nausea and vomiting.  Endocrine: Negative for polydipsia, polyphagia and polyuria.  Genitourinary:  Negative for dysuria and frequency.  Musculoskeletal:  Negative for arthralgias and myalgias.  Neurological:  Negative for dizziness and headaches.  Psychiatric/Behavioral:  Negative for dysphoric mood.        No dysphoria     Objective:  BP 138/70   Pulse 78   Temp (!) 97.3 F (36.3 C)   Resp 16   Ht '5\' 11"'$  (1.803 m)   Wt 169 lb 3.2 oz (76.7 kg)   BMI 23.60 kg/m      01/29/2022   10:25 PM 01/25/2022   10:06 AM 08/11/2021   11:01 AM  BP/Weight  Systolic BP [phone]  Diastolic BP 70 72 68  Wt. (Lbs)  169.2   BMI  23.6 kg/m2      Physical Exam Vitals reviewed.  Constitutional:      Appearance: Normal appearance.  Cardiovascular:     Rate and Rhythm: Normal rate and regular rhythm.     Heart sounds: Normal heart sounds.  Pulmonary:     Effort: Pulmonary effort is normal.     Breath sounds: Normal breath sounds. No wheezing, rhonchi or rales.  Abdominal:     General: Bowel sounds are normal.     Palpations: Abdomen is soft.     Tenderness: There is no abdominal tenderness.  Neurological:     Mental Status: He is alert.  Psychiatric:        Mood and Affect: Mood normal.        Behavior: Behavior normal.     Diabetic Foot Exam - Simple   No data filed      Lab Results  Component Value Date   WBC 8.6 01/25/2022   HGB 14.7 01/25/2022   HCT 42.8 01/25/2022   PLT 187 01/25/2022   GLUCOSE 104 (H) 01/25/2022   CHOL 158 01/25/2022   TRIG 59 01/25/2022   HDL 93 01/25/2022   LDLCALC 53 01/25/2022   ALT 45 (H) 01/25/2022   AST 33 01/25/2022   NA 139 01/25/2022   K 4.2 01/25/2022   CL 102 01/25/2022   CREATININE 0.85 01/25/2022   BUN 10 01/25/2022   CO2 20 01/25/2022   HGBA1C 5.3 12/17/2020      Assessment & Plan:   Problem List Items Addressed This Visit       Cardiovascular and Mediastinum   Essential hypertension - Primary    Well controlled.  No changes to medicines. Continue amlodipine 10 mg daily. Continue to work on eating a healthy diet and exercise.  Labs drawn today.        Relevant Orders   CBC with Differential/Platelet (Completed)   Comprehensive metabolic panel (Completed)   Atherosclerosis of aorta ([facility])    Continue crestor 5 mg before bed and aspirin 81 mg daily.         Other   Mixed hyperlipidemia    Well controlled.  No changes to medicines. Continue crestor 5 mg before bed.  Continue to work on eating a healthy diet and exercise.  Labs drawn today.        Relevant Orders   Lipid panel (Completed)   Other Visit Diagnoses     Atherosclerosis of  both carotid arteries         .  Meds ordered this encounter  Medications   Zoster Vaccine Adjuvanted [facility])  injection    Sig: Inject 0.5 mLs into the muscle once for 1 dose.    Dispense:  0.5 mL    Refill:  0   Tdap (ADACEL) 12-20-13.5 LF-MCG/0.5 injection    Sig: Inject 0.5 mLs into the muscle once for 1 dose.    Dispense:  0.5 mL    Refill:  0    Orders Placed This Encounter  Procedures   CBC with Differential/Platelet   Comprehensive metabolic panel   Lipid panel     Follow-up: Return in about 6 months (around 07/27/2022) for awv, chronic fasting.  An After Visit Summary was printed and given to the patient.  Rochel Brome, MD [facility] [phone]

## 2022-01-26 LAB — COMPREHENSIVE METABOLIC PANEL
ALT: 45 IU/L — ABNORMAL HIGH (ref 0–44)
AST: 33 IU/L (ref 0–40)
Albumin/Globulin Ratio: 1.4 (ref 1.2–2.2)
Albumin: 4.6 g/dL (ref 3.6–4.6)
Alkaline Phosphatase: 94 IU/L (ref 44–121)
BUN/Creatinine Ratio: 12 (ref 10–24)
BUN: 10 mg/dL (ref 8–27)
Bilirubin Total: 0.6 mg/dL (ref 0.0–1.2)
CO2: 20 mmol/L (ref 20–29)
Calcium: 9.3 mg/dL (ref 8.6–10.2)
Chloride: 102 mmol/L (ref 96–106)
Creatinine, Ser: 0.85 mg/dL (ref 0.76–1.27)
Globulin, Total: 3.2 g/dL (ref 1.5–4.5)
Glucose: 104 mg/dL — ABNORMAL HIGH (ref 70–99)
Potassium: 4.2 mmol/L (ref 3.5–5.2)
Sodium: 139 mmol/L (ref 134–144)
Total Protein: 7.8 g/dL (ref 6.0–8.5)
eGFR: 87 mL/min/{1.73_m2} (ref >59)

## 2022-01-26 LAB — LIPID PANEL
Chol/HDL Ratio: 1.7 ratio (ref 0.0–5.0)
Cholesterol, Total: 158 mg/dL (ref 100–199)
HDL: 93 mg/dL (ref >39)
LDL Chol Calc (NIH): 53 mg/dL (ref 0–99)
Triglycerides: 59 mg/dL (ref 0–149)
VLDL Cholesterol Cal: 12 mg/dL (ref 5–40)

## 2022-01-26 LAB — CBC WITH DIFFERENTIAL/PLATELET
Basophils Absolute: 0.1 10*3/uL (ref 0.0–0.2)
Basos: 1 %
EOS (ABSOLUTE): 0.1 10*3/uL (ref 0.0–0.4)
Eos: 2 %
Hematocrit: 42.8 % (ref 37.5–51.0)
Hemoglobin: 14.7 g/dL (ref 13.0–17.7)
Immature Grans (Abs): 0 10*3/uL (ref 0.0–0.1)
Immature Granulocytes: 0 %
Lymphocytes Absolute: 2.5 10*3/uL (ref 0.7–3.1)
Lymphs: 29 %
MCH: 32.2 pg (ref 26.6–33.0)
MCHC: 34.3 g/dL (ref 31.5–35.7)
MCV: 94 fL (ref 79–97)
Monocytes Absolute: 0.7 10*3/uL (ref 0.1–0.9)
Monocytes: 8 %
Neutrophils Absolute: 5.2 10*3/uL (ref 1.4–7.0)
Neutrophils: 60 %
Platelets: 187 10*3/uL (ref 150–450)
RBC: 4.56 x10E6/uL (ref 4.14–5.80)
RDW: 12.4 % (ref 11.6–15.4)
WBC: 8.6 10*3/uL (ref 3.4–10.8)

## 2022-01-29 DIAGNOSIS — I700 Atherosclerosis of aorta: Secondary | ICD-10-CM | POA: Insufficient documentation

## 2022-01-29 NOTE — Assessment & Plan Note (Addendum)
Well controlled.  No changes to medicines. Continue amlodipine 10 mg daily.  Continue to work on eating a healthy diet and exercise.  Labs drawn today.   

## 2022-01-29 NOTE — Assessment & Plan Note (Signed)
Continue crestor 5 mg before bed and aspirin 81 mg daily.

## 2022-01-29 NOTE — Assessment & Plan Note (Addendum)
Well controlled.  No changes to medicines. Continue crestor 5 mg before bed.  Continue to work on eating a healthy diet and exercise.  Labs drawn today.

## 2022-02-16 DIAGNOSIS — H524 Presbyopia: Secondary | ICD-10-CM | POA: Diagnosis not present

## 2022-02-17 LAB — HGB A1C W/O EAG: Hgb A1c MFr Bld: 5.3 % (ref 4.8–5.6)

## 2022-02-17 LAB — SPECIMEN STATUS REPORT

## 2022-05-24 ENCOUNTER — Ambulatory Visit: Payer: Medicare PPO | Admitting: Family Medicine

## 2022-05-24 VITALS — BP 138/76 | HR 103 | Temp 97.3°F | Ht 71.0 in | Wt 175.0 lb

## 2022-05-24 DIAGNOSIS — S8000XA Contusion of unspecified knee, initial encounter: Secondary | ICD-10-CM | POA: Diagnosis not present

## 2022-05-24 DIAGNOSIS — R17 Unspecified jaundice: Secondary | ICD-10-CM | POA: Insufficient documentation

## 2022-05-24 DIAGNOSIS — N3000 Acute cystitis without hematuria: Secondary | ICD-10-CM | POA: Diagnosis not present

## 2022-05-24 DIAGNOSIS — G4709 Other insomnia: Secondary | ICD-10-CM | POA: Diagnosis not present

## 2022-05-24 DIAGNOSIS — W1789XA Other fall from one level to another, initial encounter: Secondary | ICD-10-CM | POA: Diagnosis not present

## 2022-05-24 DIAGNOSIS — R160 Hepatomegaly, not elsewhere classified: Secondary | ICD-10-CM | POA: Diagnosis not present

## 2022-05-24 DIAGNOSIS — G8929 Other chronic pain: Secondary | ICD-10-CM

## 2022-05-24 DIAGNOSIS — I10 Essential (primary) hypertension: Secondary | ICD-10-CM

## 2022-05-24 DIAGNOSIS — R188 Other ascites: Secondary | ICD-10-CM | POA: Diagnosis not present

## 2022-05-24 DIAGNOSIS — R29898 Other symptoms and signs involving the musculoskeletal system: Secondary | ICD-10-CM | POA: Insufficient documentation

## 2022-05-24 DIAGNOSIS — M545 Low back pain, unspecified: Secondary | ICD-10-CM | POA: Diagnosis not present

## 2022-05-24 HISTORY — DX: Unspecified jaundice: R17

## 2022-05-24 LAB — CBC WITH DIFFERENTIAL/PLATELET
Basophils Absolute: 0.1 10*3/uL (ref 0.0–0.2)
Basos: 1 %
EOS (ABSOLUTE): 0 10*3/uL (ref 0.0–0.4)
Eos: 0 %
Hematocrit: 34.2 % — ABNORMAL LOW (ref 37.5–51.0)
Hemoglobin: 11.8 g/dL — ABNORMAL LOW (ref 13.0–17.7)
Immature Grans (Abs): 0.1 10*3/uL (ref 0.0–0.1)
Immature Granulocytes: 1 %
Lymphocytes Absolute: 2 10*3/uL (ref 0.7–3.1)
Lymphs: 20 %
MCH: 33.6 pg — ABNORMAL HIGH (ref 26.6–33.0)
MCHC: 34.5 g/dL (ref 31.5–35.7)
MCV: 97 fL (ref 79–97)
Monocytes Absolute: 0.7 10*3/uL (ref 0.1–0.9)
Monocytes: 8 %
Neutrophils Absolute: 6.8 10*3/uL (ref 1.4–7.0)
Neutrophils: 70 %
Platelets: 300 10*3/uL (ref 150–450)
RBC: 3.51 x10E6/uL — ABNORMAL LOW (ref 4.14–5.80)
RDW: 13.3 % (ref 11.6–15.4)
WBC: 9.7 10*3/uL (ref 3.4–10.8)

## 2022-05-24 LAB — COMPREHENSIVE METABOLIC PANEL
ALT: 171 IU/L — ABNORMAL HIGH (ref 0–44)
AST: 224 IU/L (ref 0–40)
Albumin/Globulin Ratio: 0.8 — ABNORMAL LOW (ref 1.2–2.2)
Albumin: 3.2 g/dL — ABNORMAL LOW (ref 3.7–4.7)
Alkaline Phosphatase: 878 IU/L — ABNORMAL HIGH (ref 44–121)
BUN/Creatinine Ratio: 13 (ref 10–24)
BUN: 12 mg/dL (ref 8–27)
Bilirubin Total: 7.1 mg/dL — ABNORMAL HIGH (ref 0.0–1.2)
CO2: 20 mmol/L (ref 20–29)
Calcium: 8.8 mg/dL (ref 8.6–10.2)
Chloride: 99 mmol/L (ref 96–106)
Creatinine, Ser: 0.89 mg/dL (ref 0.76–1.27)
Globulin, Total: 3.9 g/dL (ref 1.5–4.5)
Glucose: 149 mg/dL — ABNORMAL HIGH (ref 70–99)
Potassium: 4 mmol/L (ref 3.5–5.2)
Sodium: 134 mmol/L (ref 134–144)
Total Protein: 7.1 g/dL (ref 6.0–8.5)
eGFR: 86 mL/min/{1.73_m2} (ref >59)

## 2022-05-24 LAB — POCT URINALYSIS DIP (CLINITEK)
Blood, UA: NEGATIVE
Glucose, UA: NEGATIVE mg/dL
Nitrite, UA: POSITIVE — AB
Spec Grav, UA: 1.025 (ref 1.010–1.025)
Urobilinogen, UA: 4 E.U./dL — AB
pH, UA: 5.5 (ref 5.0–8.0)

## 2022-05-24 LAB — PROTIME-INR
INR: 1.1 (ref 0.9–1.2)
Prothrombin Time: 11.4 s (ref 9.1–12.0)

## 2022-05-24 LAB — ACUTE HEP PANEL AND HEP B SURFACE AB
Hep A IgM: NEGATIVE
Hep B C IgM: NEGATIVE
Hep C Virus Ab: NONREACTIVE
Hepatitis B Surf Ab Quant: <3.1 m[IU]/mL — ABNORMAL LOW (ref >9.9)
Hepatitis B Surface Ag: NEGATIVE

## 2022-05-24 MED ORDER — CIPROFLOXACIN HCL 250 MG PO TABS: 250.0000 mg | ORAL_TABLET | Freq: Two times a day (BID) | ORAL | 0 refills | Status: AC

## 2022-05-24 MED ORDER — FUROSEMIDE 40 MG PO TABS: 40.0000 mg | ORAL_TABLET | Freq: Every day | ORAL | 3 refills | Status: DC

## 2022-05-24 MED ORDER — SPIRONOLACTONE 100 MG PO TABS: 100.0000 mg | ORAL_TABLET | Freq: Every day | ORAL | 0 refills | Status: DC

## 2022-05-24 MED ORDER — TRAZODONE HCL 50 MG PO TABS: ORAL_TABLET | ORAL | 0 refills | Status: DC

## 2022-05-24 MED ORDER — LACTULOSE 20 GM/30ML PO SOLN: 30.0000 mL | Freq: Three times a day (TID) | ORAL | 0 refills | Status: DC

## 2022-05-24 NOTE — Assessment & Plan Note (Signed)
Check labs.  No Tylenol or alcohol. CT scan of abdomen and pelvis with contrast stat tomorrow.

## 2022-05-24 NOTE — Assessment & Plan Note (Signed)
Check labs 

## 2022-05-24 NOTE — Assessment & Plan Note (Addendum)
Recommend ice.  No Tylenol.  May try OTC Aleve sparingly. Order a manual wheelchair

## 2022-05-24 NOTE — Progress Notes (Incomplete)
Acute Office Visit  Subjective:    Patient ID: Walter Joseph, male    DOB: Mar 20, 1941, 81 y.o.   MRN: [id number]  Chief Complaint  Patient presents with  . Jaundice    HPI: Patient is an 81 year old white male with past medical history significant for hypertension, hyperlipidemia, and alcohol abuse who presents with his wife and daughter today for evaluation of deterioration in the last 2 weeks.  The patient family reports he said significant decreased appetite, swelling of his abdomen, and yellowing of his skin.  The patient's daughter who lives out of town in [place] is a Librarian, academic and recommended he discontinue his Crestor when she was told he was looking yellow.  This was approximately 2 days ago.  Family adamantly denies has had any issues with confusion.  Does report being nauseated.  He reports that he has decreased his alcohol intake to approximately twice a week and only 1-2 alcoholic beverages.  He says nothing tastes good including alcohol.  He also noted that he had some blood in his urine today.  Denies other bladder symptoms.  The family did test him for COVID-19 at home and it was negative today.  In addition the patient reports falling 2 days ago.  He tripped over a small step between 2 rooms in his home.  He fell straight down on his knees and hurt his back.  He has had intermittent back pain in the past however since the fall its been worse. His typical menu for a day includes: Breakfast: eggs, sausage  Lunch: hamburger Supper: skips it.  Water 16-20 oz per day.   Past Medical History:  Diagnosis Date  . Alcohol abuse   . Atherosclerosis of aorta ([place])   . Cognitive communication deficit   . Essential hypertension   . Mixed hyperlipidemia     Past Surgical History:  Procedure Laterality Date  . CATARACT EXTRACTION Right 01/17/2022    Family History  Problem Relation Age of Onset  . Stroke Mother   . Heart failure Mother   . Diabetes Mother   . Stroke  Father   . Heart failure Father   . Hypertension Father   . Heart attack Father   . Hyperlipidemia Other   . Arrhythmia Other     Social History   Socioeconomic History  . Marital status: Married    Spouse name: Not on file  . Number of children: 3  . Years of education: Not on file  . Highest education level: Not on file  Occupational History  . Occupation: retired  Tobacco Use  . Smoking status: Former    Packs/day: 1.00    Types: Cigarettes    Quit date: 1994    Years since quitting: 29.7  . Smokeless tobacco: Never  Substance and Sexual Activity  . Alcohol use: Yes    Alcohol/week: 2.0 - 3.0 standard drinks of alcohol    Types: 2 - 3 Cans of beer per week  . Drug use: Never  . Sexual activity: Not on file  Other Topics Concern  . Not on file  Social History Narrative  . Not on file   Social Determinants of Health   Financial Resource Strain: Not on file  Food Insecurity: No Food Insecurity (06/29/2021)   Hunger Vital Sign   . Worried About Charity fundraiser in the Last Year: Never true   . Ran Out of Food in the Last Year: Never true  Transportation Needs: No Transportation Needs (  06/29/2021)   PRAPARE - Transportation   . Lack of Transportation (Medical): No   . Lack of Transportation (Non-Medical): No  Physical Activity: Inactive (06/29/2021)   Exercise Vital Sign   . Days of Exercise per Week: 0 days   . Minutes of Exercise per Session: 0 min  Stress: Not on file  Social Connections: Not on file  Intimate Partner Violence: Not At Risk (06/29/2021)   Humiliation, Afraid, Rape, and Kick questionnaire   . Fear of Current or Ex-Partner: No   . Emotionally Abused: No   . Physically Abused: No   . Sexually Abused: No    Outpatient Medications Prior to Visit  Medication Sig Dispense Refill  . amLODipine (NORVASC) 10 MG tablet TAKE ONE TABLET BY MOUTH EVERY DAY 90 tablet 1  . aspirin 81 MG EC tablet Take by mouth.    . rosuvastatin (CRESTOR) 5 MG tablet  TAKE ONE TABLET BY MOUTH AT BEDTIME FOR CHOLESTEROL (Patient not taking: Reported on 05/24/2022) 90 tablet 1   No facility-administered medications prior to visit.    Allergies  Allergen Reactions  . Penicillin G Anaphylaxis  . Tetracycline Hives    Review of Systems  Constitutional:  Positive for fatigue. Negative for chills and fever.  HENT:  Negative for congestion, ear pain and sore throat.   Respiratory:  Negative for cough and shortness of breath.   Cardiovascular:  Negative for chest pain.  Gastrointestinal:  Positive for abdominal distention and nausea. Negative for abdominal pain, constipation, diarrhea and vomiting.  Endocrine: Negative for polydipsia, polyphagia and polyuria.  Genitourinary:  Positive for hematuria. Negative for dysuria and frequency.  Musculoskeletal:  Positive for arthralgias and back pain. Negative for myalgias.  Neurological:  Positive for weakness. Negative for dizziness and headaches.       No confusion. No memory problems.  Psychiatric/Behavioral:  Positive for sleep disturbance (Difficulty going to sleep. Averaging 4 hours per day. sleeps during the day.). Negative for dysphoric mood. The patient is not nervous/anxious.        No dysphoria       Objective:    Physical Exam Vitals reviewed.  Constitutional:      Appearance: Normal appearance.  Eyes:     General: Scleral icterus present.  Neck:     Vascular: No carotid bruit.  Cardiovascular:     Rate and Rhythm: Normal rate and regular rhythm.     Pulses: Normal pulses.     Heart sounds: Normal heart sounds.  Pulmonary:     Effort: Pulmonary effort is normal.     Breath sounds: Normal breath sounds. No wheezing, rhonchi or rales.  Abdominal:     General: Bowel sounds are normal. There is distension.     Palpations: Abdomen is soft. There is mass.     Tenderness: There is no abdominal tenderness. There is no rebound.     Comments: Hepatomegaly  Musculoskeletal:        General:  Tenderness (Lumbar spine.  Bilateral knees.) present.  Neurological:     General: No focal deficit present.     Mental Status: He is alert.     Gait: Gait abnormal.     Comments: No asterixis  Psychiatric:        Mood and Affect: Mood normal.        Behavior: Behavior normal.     BP 138/76   Pulse (!) 103   Temp (!) 97.3 F (36.3 C)   Ht 5' 11"  (1.803 m)  Wt 175 lb (79.4 kg)   SpO2 99%   BMI 24.41 kg/m  Wt Readings from Last 3 Encounters:  05/24/22 175 lb (79.4 kg)  01/25/22 169 lb 3.2 oz (76.7 kg)  06/30/21 174 lb (78.9 kg)    Health Maintenance Due  Topic Date Due  . TETANUS/TDAP  Never done  . Zoster Vaccines- Shingrix (1 of 2) Never done  . Pneumonia Vaccine 45+ Years old (2 - PCV) 07/02/2021  . COVID-19 Vaccine (5 - Pfizer series) 10/01/2021  . INFLUENZA VACCINE  03/21/2022    There are no preventive care reminders to display for this patient.   No results found for: "TSH" Lab Results  Component Value Date   WBC 9.7 05/24/2022   HGB 11.8 (L) 05/24/2022   HCT 34.2 (L) 05/24/2022   MCV 97 05/24/2022   PLT 300 05/24/2022   Lab Results  Component Value Date   NA 134 05/24/2022   K 4.0 05/24/2022   CO2 20 05/24/2022   GLUCOSE 149 (H) 05/24/2022   BUN 12 05/24/2022   CREATININE 0.89 05/24/2022   BILITOT 7.1 (H) 05/24/2022   ALKPHOS 878 (H) 05/24/2022   AST 224 (HH) 05/24/2022   ALT 171 (H) 05/24/2022   PROT 7.1 05/24/2022   ALBUMIN 3.2 (L) 05/24/2022   CALCIUM 8.8 05/24/2022   EGFR 86 05/24/2022   Lab Results  Component Value Date   CHOL 158 01/25/2022   Lab Results  Component Value Date   HDL 93 01/25/2022   Lab Results  Component Value Date   LDLCALC 53 01/25/2022   Lab Results  Component Value Date   TRIG 59 01/25/2022   Lab Results  Component Value Date   CHOLHDL 1.7 01/25/2022   Lab Results  Component Value Date   HGBA1C 5.3 01/25/2022       Assessment & Plan:   Problem List Items Addressed This Visit       Digestive    Hepatomegaly - Primary    Check labs.  No Tylenol or alcohol. CT scan of abdomen and pelvis with contrast stat tomorrow.      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 Order CT scan of abdomen and pelvis with contrast in a.m. Depending on CT scan patient will need a diagnostic paracentesis. Refer to [facility]. Started on spironolactone 100 mg daily, Lasix 40 mg daily. Start on lactulose 30 mL 3 times daily.        Genitourinary   Acute cystitis without hematuria    Cipro 250 mg 1 p.o. twice daily for 5 days.      Relevant Orders   POCT URINALYSIS DIP (CLINITEK) (Completed)   Urine Culture     Other   Jaundice    Check labs.  Bilirubin is 2+ positive in urine.       Relevant Orders   CBC with Differential/Platelet (Completed)   Comprehensive metabolic panel (Completed)   Protime-INR (Completed)   Acute Hep Panel & Hep B Surface Ab (Completed)   Icterus    Check labs.      Chronic midline low back pain without sciatica    Recommend ice.  No Tylenol.  May try OTC Aleve sparingly.      Relevant Medications   traZODone (DESYREL) 50 MG tablet  Contusion of knee    Recommend ice.  No Tylenol.  May take Aleve sparingly.      Fall from one level to another   Weakness of both legs    Order manual wheelchair      Meds ordered this encounter  Medications  . Lactulose 20 GM/30ML SOLN    Sig: Take 30 mLs (20 g total) by mouth in the morning, at noon, and at bedtime.    Dispense:  473 mL    Refill:  0  . spironolactone (ALDACTONE) 100 MG tablet    Sig: Take 1 tablet (100 mg total) by mouth daily.    Dispense:  30 tablet    Refill:  0  . furosemide (LASIX) 40 MG tablet    Sig: Take 1 tablet (40 mg total) by mouth daily.    Dispense:  30 tablet     Refill:  3  . ciprofloxacin (CIPRO) 250 MG tablet    Sig: Take 1 tablet (250 mg total) by mouth 2 (two) times daily for 5 days.    Dispense:  10 tablet    Refill:  0  . traZODone (DESYREL) 50 MG tablet    Sig: 1/2 to 1 tablet before bed.    Dispense:  30 tablet    Refill:  0    Orders Placed This Encounter  Procedures  . Urine Culture  . CBC with Differential/Platelet  . Comprehensive metabolic panel  . Protime-INR  . Acute Hep Panel & Hep B Surface Ab  . POCT URINALYSIS DIP (CLINITEK)     Follow-up: No follow-ups on file.  An After Visit Summary was printed and given to the patient.  Rochel Brome, MD [facility] [phone]

## 2022-05-24 NOTE — Assessment & Plan Note (Signed)
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 Order CT scan of abdomen and pelvis with contrast in a.m. Depending on CT scan patient will need a diagnostic paracentesis. Refer to [facility]. Started on spironolactone 100 mg daily, Lasix 40 mg daily. Start on lactulose 30 mL 3 times daily. Hold Crestor

## 2022-05-24 NOTE — Patient Instructions (Addendum)
For ascites: Start on lactulose 30 mL 3 times a day. Start on spironolactone 100 mg once a day. Start on Lasix 40 mg once daily Ordering ct scan of abdomen/pelvis.  NO alcohol or tylenol.  For bladder infection start Cipro 250 mg 1 twice daily until gone.  Start trazodone for sleep.

## 2022-05-24 NOTE — Assessment & Plan Note (Signed)
Recommend ice.  No Tylenol.  May take Aleve sparingly.

## 2022-05-24 NOTE — Progress Notes (Unsigned)
Acute Office Visit  Subjective:    Patient ID: Walter Joseph, male    DOB: [birth year], 81 y.o.   MRN: [id number]  No chief complaint on file.   HPI: Patient is in today for wife states that patient has had a loss of appetite and appears to swollen in his abdomen, denies abdominal pain. Daughter states she feels like patient has a yellowish tint to skin. Nauseated. Covid 19 test negative.  Blood in urine. NO dysuria.   Breakfast: eggs, sausage  Lunch: hamburger Supper: skips it.  16-20 oz per day.   Fell 2 days ago. Tripped over small step. Fell straight down on knees. Back pain comes and goes, but worse since the fall.    Past Medical History:  Diagnosis Date   Alcohol abuse    Atherosclerosis of aorta ([facility])    Cognitive communication deficit    Essential hypertension    Mixed hyperlipidemia     Past Surgical History:  Procedure Laterality Date   CATARACT EXTRACTION Right 01/17/2022    Family History  Problem Relation Age of Onset   Stroke Mother    Heart failure Mother    Diabetes Mother    Stroke Father    Heart failure Father    Hypertension Father    Heart attack Father    Hyperlipidemia Other    Arrhythmia Other     Social History   Socioeconomic History   Marital status: Married    Spouse name: Not on file   Number of children: 3   Years of education: Not on file   Highest education level: Not on file  Occupational History   Occupation: retired  Tobacco Use   Smoking status: Former    Packs/day: 1.00    Types: Cigarettes    Quit date: 1994    Years since quitting: 29.7   Smokeless tobacco: Never  Substance and Sexual Activity   Alcohol use: Yes    Alcohol/week: 2.0 - 3.0 standard drinks of alcohol    Types: 2 - 3 Cans of beer per week   Drug use: Never   Sexual activity: Not on file  Other Topics Concern   Not on file  Social History Narrative   Not on file   Social Determinants of Health   Financial Resource Strain: Not on file   Food Insecurity: No Food Insecurity (06/29/2021)   Hunger Vital Sign    Worried About Running Out of Food in the Last Year: Never true    Ran Out of Food in the Last Year: Never true  Transportation Needs: No Transportation Needs (06/29/2021)   PRAPARE - Hydrologist (Medical): No    Lack of Transportation (Non-Medical): No  Physical Activity: Inactive (06/29/2021)   Exercise Vital Sign    Days of Exercise per Week: 0 days    Minutes of Exercise per Session: 0 min  Stress: Not on file  Social Connections: Not on file  Intimate Partner Violence: Not At Risk (06/29/2021)   Humiliation, Afraid, Rape, and Kick questionnaire    Fear of Current or Ex-Partner: No    Emotionally Abused: No    Physically Abused: No    Sexually Abused: No    Outpatient Medications Prior to Visit  Medication Sig Dispense Refill   amLODipine (NORVASC) 10 MG tablet TAKE ONE TABLET BY MOUTH EVERY DAY 90 tablet 1   aspirin 81 MG EC tablet Take by mouth.     rosuvastatin (CRESTOR) 5 MG  tablet TAKE ONE TABLET BY MOUTH AT BEDTIME FOR CHOLESTEROL (Patient not taking: Reported on 05/24/2022) 90 tablet 1   No facility-administered medications prior to visit.    Allergies  Allergen Reactions   Penicillin G Anaphylaxis   Tetracycline Hives    Review of Systems  Constitutional:  Negative for chills, fatigue and fever.  HENT:  Negative for congestion, ear pain and sore throat.   Respiratory:  Negative for cough and shortness of breath.   Cardiovascular:  Negative for chest pain.  Gastrointestinal:  Positive for abdominal distention and nausea. Negative for abdominal pain, constipation, diarrhea and vomiting.  Endocrine: Negative for polydipsia, polyphagia and polyuria.  Genitourinary:  Positive for hematuria. Negative for dysuria and frequency.  Musculoskeletal:  Positive for arthralgias and back pain. Negative for myalgias.  Neurological:  Negative for dizziness and headaches.       No  confusion. No memory problems.  Psychiatric/Behavioral:  Positive for sleep disturbance (Difficulty going to sleep. Averaging 4 hours per day. sleeps during the day.). Negative for dysphoric mood. The patient is not nervous/anxious.        No dysphoria       Objective:    Physical Exam Vitals reviewed.  Constitutional:      Appearance: Normal appearance.  Eyes:     General: Scleral icterus present.  Neck:     Vascular: No carotid bruit.  Cardiovascular:     Rate and Rhythm: Normal rate and regular rhythm.     Pulses: Normal pulses.     Heart sounds: Normal heart sounds.  Pulmonary:     Effort: Pulmonary effort is normal.     Breath sounds: Normal breath sounds. No wheezing, rhonchi or rales.  Abdominal:     General: Bowel sounds are normal. There is distension.     Palpations: Abdomen is soft. There is mass.     Tenderness: There is no abdominal tenderness. There is no rebound.     Comments: Hepatomegaly  Neurological:     Mental Status: He is alert.  Psychiatric:        Mood and Affect: Mood normal.        Behavior: Behavior normal.     BP 138/76   Pulse (!) 103   Temp (!) 97.3 F (36.3 C)   Ht _0  (1.803 m)   Wt 175 lb (79.4 kg)   SpO2 99%   BMI 24.41 kg/m  Wt Readings from Last 3 Encounters:  05/24/22 175 lb (79.4 kg)  01/25/22 169 lb 3.2 oz (76.7 kg)  06/30/21 174 lb (78.9 kg)    Health Maintenance Due  Topic Date Due   TETANUS/TDAP  Never done   Zoster Vaccines- Shingrix (1 of 2) Never done   Pneumonia Vaccine 66+ Years old (2 - PCV) 07/02/2021   COVID-19 Vaccine (5 - Pfizer series) 10/01/2021   INFLUENZA VACCINE  03/21/2022    There are no preventive care reminders to display for this patient.   No results found for: "TSH" Lab Results  Component Value Date   WBC 8.6 01/25/2022   HGB 14.7 01/25/2022   HCT 42.8 01/25/2022   MCV 94 01/25/2022   PLT 187 01/25/2022   Lab Results  Component Value Date   NA 139 01/25/2022   K 4.2 01/25/2022    CO2 20 01/25/2022   GLUCOSE 104 (H) 01/25/2022   BUN 10 01/25/2022   CREATININE 0.85 01/25/2022   BILITOT 0.6 01/25/2022   ALKPHOS 94 01/25/2022   AST 33  01/25/2022   ALT 45 (H) 01/25/2022   PROT 7.8 01/25/2022   ALBUMIN 4.6 01/25/2022   CALCIUM 9.3 01/25/2022   EGFR 87 01/25/2022   Lab Results  Component Value Date   CHOL 158 01/25/2022   Lab Results  Component Value Date   HDL 93 01/25/2022   Lab Results  Component Value Date   LDLCALC 53 01/25/2022   Lab Results  Component Value Date   TRIG 59 01/25/2022   Lab Results  Component Value Date   CHOLHDL 1.7 01/25/2022   Lab Results  Component Value Date   HGBA1C 5.3 01/25/2022       Assessment & Plan:   Problem List Items Addressed This Visit   None  No orders of the defined types were placed in this encounter.   No orders of the defined types were placed in this encounter.    Follow-up: No follow-ups on file.  An After Visit Summary was printed and given to the patient.  Rochel Brome, MD [facility] [phone]

## 2022-05-24 NOTE — Assessment & Plan Note (Signed)
Check labs.  Bilirubin is 2+ positive in urine.

## 2022-05-24 NOTE — Assessment & Plan Note (Signed)
Cipro 250 mg 1 p.o. twice daily for 5 days.

## 2022-05-24 NOTE — Assessment & Plan Note (Signed)
Order manual wheelchair

## 2022-05-25 ENCOUNTER — Encounter: Payer: Self-pay | Admitting: Family Medicine

## 2022-05-25 ENCOUNTER — Other Ambulatory Visit: Payer: Medicare PPO

## 2022-05-25 DIAGNOSIS — K8020 Calculus of gallbladder without cholecystitis without obstruction: Secondary | ICD-10-CM | POA: Diagnosis not present

## 2022-05-25 DIAGNOSIS — K766 Portal hypertension: Secondary | ICD-10-CM | POA: Diagnosis not present

## 2022-05-25 DIAGNOSIS — I8510 Secondary esophageal varices without bleeding: Secondary | ICD-10-CM | POA: Diagnosis not present

## 2022-05-25 DIAGNOSIS — R7401 Elevation of levels of liver transaminase levels: Secondary | ICD-10-CM | POA: Diagnosis not present

## 2022-05-25 DIAGNOSIS — E871 Hypo-osmolality and hyponatremia: Secondary | ICD-10-CM | POA: Diagnosis not present

## 2022-05-25 DIAGNOSIS — N400 Enlarged prostate without lower urinary tract symptoms: Secondary | ICD-10-CM | POA: Diagnosis not present

## 2022-05-25 DIAGNOSIS — K7460 Unspecified cirrhosis of liver: Secondary | ICD-10-CM | POA: Diagnosis not present

## 2022-05-25 DIAGNOSIS — K8071 Calculus of gallbladder and bile duct without cholecystitis with obstruction: Secondary | ICD-10-CM | POA: Diagnosis not present

## 2022-05-25 DIAGNOSIS — N200 Calculus of kidney: Secondary | ICD-10-CM | POA: Diagnosis not present

## 2022-05-25 DIAGNOSIS — C240 Malignant neoplasm of extrahepatic bile duct: Secondary | ICD-10-CM | POA: Diagnosis not present

## 2022-05-25 DIAGNOSIS — K7031 Alcoholic cirrhosis of liver with ascites: Secondary | ICD-10-CM | POA: Diagnosis not present

## 2022-05-25 DIAGNOSIS — D649 Anemia, unspecified: Secondary | ICD-10-CM | POA: Diagnosis not present

## 2022-05-25 DIAGNOSIS — R8569 Abnormal cytological findings in specimens from other digestive organs and abdominal cavity: Secondary | ICD-10-CM | POA: Diagnosis not present

## 2022-05-25 DIAGNOSIS — I700 Atherosclerosis of aorta: Secondary | ICD-10-CM | POA: Diagnosis not present

## 2022-05-25 DIAGNOSIS — K8689 Other specified diseases of pancreas: Secondary | ICD-10-CM | POA: Diagnosis not present

## 2022-05-25 DIAGNOSIS — K8050 Calculus of bile duct without cholangitis or cholecystitis without obstruction: Secondary | ICD-10-CM | POA: Diagnosis not present

## 2022-05-25 DIAGNOSIS — F1020 Alcohol dependence, uncomplicated: Secondary | ICD-10-CM | POA: Diagnosis not present

## 2022-05-25 DIAGNOSIS — I10 Essential (primary) hypertension: Secondary | ICD-10-CM | POA: Diagnosis not present

## 2022-05-25 NOTE — Addendum Note (Signed)
Addended byRochel Brome on: 05/25/2022 08:20 AM   Modules accepted: Orders

## 2022-05-25 NOTE — Assessment & Plan Note (Signed)
Start trazodone 50 mg half to 1 nightly.

## 2022-05-25 NOTE — Assessment & Plan Note (Signed)
We may need to hold amlodipine if spironolactone and Lasix lowers blood pressure.  Requested they check his blood pressure twice daily.

## 2022-05-26 DIAGNOSIS — I10 Essential (primary) hypertension: Secondary | ICD-10-CM | POA: Diagnosis not present

## 2022-05-26 DIAGNOSIS — C240 Malignant neoplasm of extrahepatic bile duct: Secondary | ICD-10-CM | POA: Diagnosis not present

## 2022-05-26 DIAGNOSIS — R8569 Abnormal cytological findings in specimens from other digestive organs and abdominal cavity: Secondary | ICD-10-CM | POA: Diagnosis not present

## 2022-05-26 DIAGNOSIS — K8050 Calculus of bile duct without cholangitis or cholecystitis without obstruction: Secondary | ICD-10-CM | POA: Diagnosis not present

## 2022-05-26 LAB — URINE CULTURE: Organism ID, Bacteria: NO GROWTH

## 2022-05-29 ENCOUNTER — Other Ambulatory Visit: Payer: Medicare PPO

## 2022-05-29 ENCOUNTER — Encounter: Payer: Self-pay | Admitting: *Deleted

## 2022-05-29 ENCOUNTER — Other Ambulatory Visit: Payer: Self-pay

## 2022-05-29 ENCOUNTER — Telehealth: Payer: Self-pay | Admitting: *Deleted

## 2022-05-29 DIAGNOSIS — D649 Anemia, unspecified: Secondary | ICD-10-CM

## 2022-05-29 DIAGNOSIS — K7469 Other cirrhosis of liver: Secondary | ICD-10-CM

## 2022-05-29 DIAGNOSIS — R17 Unspecified jaundice: Secondary | ICD-10-CM

## 2022-05-29 DIAGNOSIS — R160 Hepatomegaly, not elsewhere classified: Secondary | ICD-10-CM

## 2022-05-29 LAB — CBC WITH DIFF/PLATELET
Basophils Absolute: 0.1 10*3/uL (ref 0.0–0.2)
Basos: 1 %
EOS (ABSOLUTE): 0.1 10*3/uL (ref 0.0–0.4)
Eos: 1 %
Hematocrit: 33 % — ABNORMAL LOW (ref 37.5–51.0)
Hemoglobin: 11.8 g/dL — ABNORMAL LOW (ref 13.0–17.7)
Immature Grans (Abs): 0.2 10*3/uL — ABNORMAL HIGH (ref 0.0–0.1)
Immature Granulocytes: 2 %
Lymphocytes Absolute: 2.4 10*3/uL (ref 0.7–3.1)
Lymphs: 24 %
MCH: 35.4 pg — ABNORMAL HIGH (ref 26.6–33.0)
MCHC: 35.8 g/dL — ABNORMAL HIGH (ref 31.5–35.7)
MCV: 99 fL — ABNORMAL HIGH (ref 79–97)
Monocytes Absolute: 0.9 10*3/uL (ref 0.1–0.9)
Monocytes: 9 %
Neutrophils Absolute: 6.3 10*3/uL (ref 1.4–7.0)
Neutrophils: 63 %
Platelets: 299 10*3/uL (ref 150–450)
RBC: 3.33 x10E6/uL — ABNORMAL LOW (ref 4.14–5.80)
RDW: 12.8 % (ref 11.6–15.4)
WBC: 9.9 10*3/uL (ref 3.4–10.8)

## 2022-05-29 LAB — COMPREHENSIVE METABOLIC PANEL
ALT: 75 IU/L — ABNORMAL HIGH (ref 0–44)
AST: 67 IU/L — ABNORMAL HIGH (ref 0–40)
Albumin/Globulin Ratio: 0.9 — ABNORMAL LOW (ref 1.2–2.2)
Albumin: 3.1 g/dL — ABNORMAL LOW (ref 3.7–4.7)
Alkaline Phosphatase: 554 IU/L — ABNORMAL HIGH (ref 44–121)
BUN/Creatinine Ratio: 17 (ref 10–24)
BUN: 15 mg/dL (ref 8–27)
Bilirubin Total: 3.5 mg/dL — ABNORMAL HIGH (ref 0.0–1.2)
CO2: 21 mmol/L (ref 20–29)
Calcium: 8.7 mg/dL (ref 8.6–10.2)
Chloride: 100 mmol/L (ref 96–106)
Creatinine, Ser: 0.89 mg/dL (ref 0.76–1.27)
Globulin, Total: 3.4 g/dL (ref 1.5–4.5)
Glucose: 140 mg/dL — ABNORMAL HIGH (ref 70–99)
Potassium: 3.7 mmol/L (ref 3.5–5.2)
Sodium: 135 mmol/L (ref 134–144)
Total Protein: 6.5 g/dL (ref 6.0–8.5)
eGFR: 86 mL/min/{1.73_m2} (ref >59)

## 2022-05-29 LAB — LIPASE: Lipase: 12 U/L — ABNORMAL LOW (ref 13–78)

## 2022-05-29 LAB — AMYLASE: Amylase: 18 U/L — ABNORMAL LOW (ref 31–110)

## 2022-05-29 NOTE — Patient Outreach (Signed)
  Care Coordination [facility] Note Transition Care Management Follow-up Telephone Call Date of discharge and from where: Saturday, October [facility], [facility]; increased bilirubin, anemia, ERCP How have you been since you were released from the hospital? Per caregiver/ daughter Ailene Ravel on [facility] DPR "Overall he is doing much better, we are taking care of him for everything he needs and we will be seeing Dr. Tobie Poet tomorrow.  I am a PA and am making sure everything is in place" Any questions or concerns? No  Items Reviewed: Did the pt receive and understand the discharge instructions provided? Yes  Medications obtained and verified? Yes  Other? No  Any new allergies since your discharge? No  Dietary orders reviewed? Yes Do you have support at home? Yes  Spouse and daughters assisting as indicated/ needed  Home Care and Equipment/Supplies: Were home health services ordered? yes If so, what is the name of the agency? Caregiver unsure-- daughter Ailene Ravel is a PA and understands to discuss home health services and any equipment needs with PCP-- later in our call she retrieved After Visit Summary and determined agency is [facility]- confirms she has the contact information on AVS Has the agency set up a time to come to the patient's home? No-- but they have contacted patient/ caregivers Were any new equipment or medical supplies ordered?  No What is the name of the medical supply agency? N/A Were you able to get the supplies/equipment? not applicable Do you have any questions related to the use of the equipment or supplies? No N/A  Functional Questionnaire: (I = Independent and D = Dependent) ADLs: D  Spouse and daughters assisting as indicated/ needed  Bathing/Dressing- D  Spouse and daughters assisting as indicated/ needed  Meal Prep- D  Spouse and daughters assisting as indicated/ needed  Eating- I  Maintaining continence- D  Spouse and daughters assisting as indicated/  needed  Transferring/Ambulation- D Spouse and daughters assisting as indicated/ needed   Managing Meds- D  Spouse and daughters assisting as indicated/ needed  Follow up appointments reviewed:  PCP Hospital f/u appt confirmed? Yes  Scheduled to see PCP Tuesday, 05/30/22 @ 11:00 am Specialist Hospital f/u appt confirmed? No  Scheduled to see - on - @ - Are transportation arrangements needed? No  If their condition worsens, is the pt aware to call PCP or go to the Emergency Dept.? Yes Was the patient provided with contact information for the PCP's office or ED? Yes Was to pt encouraged to call back with questions or concerns? Yes  SDOH assessments and interventions completed:   Yes  Care Coordination Interventions Activated:  Yes   Care Coordination Interventions:  Provided education around what to expect with home health services, including disciplines of services that may be indicated/ available and encouraged caregiver to discuss with PCP at scheduled office visit tomorrow    Encounter Outcome:  Pt. Visit Completed    Oneta Rack, RN, BSN, CCRN Alumnus RN CM Care Coordination/ Transition of [place] Management [phone]: direct office

## 2022-05-30 ENCOUNTER — Ambulatory Visit (INDEPENDENT_AMBULATORY_CARE_PROVIDER_SITE_OTHER): Payer: Medicare PPO | Admitting: Family Medicine

## 2022-05-30 ENCOUNTER — Other Ambulatory Visit: Payer: Medicare PPO

## 2022-05-30 ENCOUNTER — Encounter: Payer: Self-pay | Admitting: Family Medicine

## 2022-05-30 VITALS — BP 140/70 | HR 93 | Temp 97.9°F | Ht 71.0 in | Wt 178.0 lb

## 2022-05-30 DIAGNOSIS — D649 Anemia, unspecified: Secondary | ICD-10-CM | POA: Diagnosis not present

## 2022-05-30 DIAGNOSIS — K7031 Alcoholic cirrhosis of liver with ascites: Secondary | ICD-10-CM

## 2022-05-30 DIAGNOSIS — Z23 Encounter for immunization: Secondary | ICD-10-CM | POA: Diagnosis not present

## 2022-05-30 DIAGNOSIS — F418 Other specified anxiety disorders: Secondary | ICD-10-CM

## 2022-05-30 MED ORDER — ALPRAZOLAM 0.25 MG PO TABS: 0.2500 mg | ORAL_TABLET | Freq: Two times a day (BID) | ORAL | 0 refills | Status: DC | PRN

## 2022-05-30 NOTE — Progress Notes (Signed)
Subjective:  Patient ID: Walter Joseph, male    DOB: [birth year]  Age: 81 y.o. MRN: [id number]  Chief Complaint  Patient presents with   Hospital follow up    HPI Patient is an 81 year old white male who presents for hospital follow up for new onset ascites, hyperbilirubinemia, biliary stenosis, and liver cirrhosis.  Patient has a significant history of alcohol abuse over his lifetime.  While in the hospital the patient underwent an MRCP. Acute hepatitis panel was normal.  aFP was normal.  Patient underwent an ERCP by Dr. Lyda Jester and a stent was placed in his common bile duct.  Biopsies were taken.  He did have some sludge in the common bile duct but no stones.  His CT scan prior to his admission that showed a 14 mm dilation of the biliary duct.  His bilirubin was approximately 8.  Patient was discharged home with understanding of getting a paracentesis scheduled this week.  Is scheduled for Wednesday at [facility].  This is mainly for diagnostic purposes although in May have some therapeutic benefits.  Bilirubin on admission was 7.2. AST and ALT were 249 and 162 respectively. Albumin  was 3. Alkaline phosphatase was 722. Repeat labs yesterday. Improved.   Recent Results (from the past 2160 hour(s))  POCT URINALYSIS DIP (CLINITEK)     Status: Abnormal   Collection Time: 05/24/22  4:03 PM  Result Value Ref Range   Color, UA brown (A) yellow   Clarity, UA clear clear   Glucose, UA negative negative mg/dL   Bilirubin, UA small (A) negative   Ketones, POC UA trace (5) (A) negative mg/dL   Spec Grav, UA 1.025 1.010 - 1.025   Blood, UA negative negative   pH, UA 5.5 5.0 - 8.0   POC PROTEIN,UA trace negative, trace   Urobilinogen, UA 4.0 (A) 0.2 or 1.0 E.U./dL   Nitrite, UA Positive (A) Negative   Leukocytes, UA Small (1+) (A) Negative  Urine Culture     Status: None   Collection Time: 05/24/22  4:11 PM   Specimen: Urine   UR  Result Value Ref Range   Urine Culture, Routine  Final report    Organism ID, Bacteria No growth   CBC with Differential/Platelet     Status: Abnormal   Collection Time: 05/24/22  4:12 PM  Result Value Ref Range   WBC 9.7 3.4 - 10.8 x10E3/uL   RBC 3.51 (L) 4.14 - 5.80 x10E6/uL   Hemoglobin 11.8 (L) 13.0 - 17.7 g/dL   Hematocrit 34.2 (L) 37.5 - 51.0 %   MCV 97 79 - 97 fL   MCH 33.6 (H) 26.6 - 33.0 pg   MCHC 34.5 31.5 - 35.7 g/dL   RDW 13.3 11.6 - 15.4 %   Platelets 300 150 - 450 x10E3/uL   Neutrophils 70 Not Estab. %   Lymphs 20 Not Estab. %   Monocytes 8 Not Estab. %   Eos 0 Not Estab. %   Basos 1 Not Estab. %   Neutrophils Absolute 6.8 1.4 - 7.0 x10E3/uL   Lymphocytes Absolute 2.0 0.7 - 3.1 x10E3/uL   Monocytes Absolute 0.7 0.1 - 0.9 x10E3/uL   EOS (ABSOLUTE) 0.0 0.0 - 0.4 x10E3/uL   Basophils Absolute 0.1 0.0 - 0.2 x10E3/uL   Immature Granulocytes 1 Not Estab. %   Immature Grans (Abs) 0.1 0.0 - 0.1 x10E3/uL  Comprehensive metabolic panel     Status: Abnormal   Collection Time: 05/24/22  4:12 PM  Result Value Ref  Range   Glucose 149 (H) 70 - 99 mg/dL   BUN 12 8 - 27 mg/dL   Creatinine, Ser 0.89 0.76 - 1.27 mg/dL   eGFR 86 >59 mL/min/1.73   BUN/Creatinine Ratio 13 10 - 24   Sodium 134 134 - 144 mmol/L   Potassium 4.0 3.5 - 5.2 mmol/L   Chloride 99 96 - 106 mmol/L   CO2 20 20 - 29 mmol/L   Calcium 8.8 8.6 - 10.2 mg/dL   Total Protein 7.1 6.0 - 8.5 g/dL    Comment: Specimen received icteric. Value may be increased or decreased by icterus. Clinical correlation indicated.    Albumin 3.2 (L) 3.7 - 4.7 g/dL   Globulin, Total 3.9 1.5 - 4.5 g/dL   Albumin/Globulin Ratio 0.8 (L) 1.2 - 2.2   Bilirubin Total 7.1 (H) 0.0 - 1.2 mg/dL    Comment: Specimen received icteric. Value may be increased or decreased by icterus. Clinical correlation indicated.    Alkaline Phosphatase 878 (H) 44 - 121 IU/L   AST 224 (HH) 0 - 40 IU/L    Comment:                   Client Requested Flag   ALT 171 (H) 0 - 44 IU/L  Protime-INR     Status:  None   Collection Time: 05/24/22  4:12 PM  Result Value Ref Range   INR 1.1 0.9 - 1.2    Comment: Reference interval is for non-anticoagulated patients. Suggested INR therapeutic range for Vitamin K antagonist therapy:    Standard Dose (moderate intensity                   therapeutic range):       2.0 - 3.0    Higher intensity therapeutic range       2.5 - 3.5    Prothrombin Time 11.4 9.1 - 12.0 sec  Acute Hep Panel & Hep B Surface Ab     Status: Abnormal   Collection Time: 05/24/22  4:12 PM  Result Value Ref Range   Hep A IgM Negative Negative   Hepatitis B Surface Ag Negative Negative   Hep B C IgM Negative Negative   Hepatitis B Surf Ab Quant <3.1 (L) Immunity>9.9 mIU/mL    Comment:   Status of Immunity                     Anti-HBs Level   ------------------                     -------------- Inconsistent with Immunity                   0.0 - 9.9 Consistent with Immunity                          >9.9    Hep C Virus Ab Non Reactive Non Reactive    Comment: HCV antibody alone does not differentiate between previously resolved infection and active infection. Equivocal and Reactive HCV antibody results should be followed up with an HCV RNA test to support the diagnosis of active HCV infection.   Lipase     Status: Abnormal   Collection Time: 05/29/22  1:43 PM  Result Value Ref Range   Lipase 12 (L) 13 - 78 U/L  Amylase     Status: Abnormal   Collection Time: 05/29/22  1:43 PM  Result Value Ref  Range   Amylase 18 (L) 31 - 110 U/L  Comprehensive metabolic panel     Status: Abnormal   Collection Time: 05/29/22  1:43 PM  Result Value Ref Range   Glucose 140 (H) 70 - 99 mg/dL   BUN 15 8 - 27 mg/dL   Creatinine, Ser 0.89 0.76 - 1.27 mg/dL   eGFR 86 >59 mL/min/1.73   BUN/Creatinine Ratio 17 10 - 24   Sodium 135 134 - 144 mmol/L   Potassium 3.7 3.5 - 5.2 mmol/L   Chloride 100 96 - 106 mmol/L   CO2 21 20 - 29 mmol/L   Calcium 8.7 8.6 - 10.2 mg/dL   Total Protein 6.5 6.0 -  8.5 g/dL    Comment: Note: Specimen is icteric.   Albumin 3.1 (L) 3.7 - 4.7 g/dL   Globulin, Total 3.4 1.5 - 4.5 g/dL   Albumin/Globulin Ratio 0.9 (L) 1.2 - 2.2   Bilirubin Total 3.5 (H) 0.0 - 1.2 mg/dL   Alkaline Phosphatase 554 (H) 44 - 121 IU/L   AST 67 (H) 0 - 40 IU/L   ALT 75 (H) 0 - 44 IU/L  CBC With Diff/Platelet     Status: Abnormal   Collection Time: 05/29/22  1:43 PM  Result Value Ref Range   WBC 9.9 3.4 - 10.8 x10E3/uL   RBC 3.33 (L) 4.14 - 5.80 x10E6/uL   Hemoglobin 11.8 (L) 13.0 - 17.7 g/dL   Hematocrit 33.0 (L) 37.5 - 51.0 %   MCV 99 (H) 79 - 97 fL   MCH 35.4 (H) 26.6 - 33.0 pg   MCHC 35.8 (H) 31.5 - 35.7 g/dL   RDW 12.8 11.6 - 15.4 %   Platelets 299 150 - 450 x10E3/uL   Neutrophils 63 Not Estab. %   Lymphs 24 Not Estab. %   Monocytes 9 Not Estab. %   Eos 1 Not Estab. %   Basos 1 Not Estab. %   Neutrophils Absolute 6.3 1.4 - 7.0 x10E3/uL   Lymphocytes Absolute 2.4 0.7 - 3.1 x10E3/uL   Monocytes Absolute 0.9 0.1 - 0.9 x10E3/uL   EOS (ABSOLUTE) 0.1 0.0 - 0.4 x10E3/uL   Basophils Absolute 0.1 0.0 - 0.2 x10E3/uL   Immature Granulocytes 2 Not Estab. %   Immature Grans (Abs) 0.2 (H) 0.0 - 0.1 x10E3/uL    Comment: (An elevated percentage of Immature Granulocytes has not been found to be clinically significant as a sole clinical predictor of disease. Does NOT include bands or blast cells.  Pregnancy associated physiological leukocytosis may also show increased immature granulocytes without clinical significance.)   B12 and Folate Panel     Status: Abnormal   Collection Time: 05/29/22  1:43 PM  Result Value Ref Range   Vitamin B-12 >2000 (H) 232 - 1245 pg/mL   Folate 9.4 >3.0 ng/mL    Comment: A serum folate concentration of less than 3.1 ng/mL is considered to represent clinical deficiency.   Specimen status report     Status: None   Collection Time: 05/29/22  1:43 PM  Result Value Ref Range   specimen status report Comment     Comment: Written  Authorization Written Authorization Written Authorization Received. Authorization received from [employer] 05-31-2022 Logged by Leona Carry     Current Outpatient Medications on File Prior to Visit  Medication Sig Dispense Refill   furosemide (LASIX) 40 MG tablet Take 1 tablet (40 mg total) by mouth daily. 30 tablet 3   spironolactone (ALDACTONE) 100 MG tablet Take  1 tablet (100 mg total) by mouth daily. 30 tablet 0   traZODone (DESYREL) 50 MG tablet 1/2 to 1 tablet before bed. 30 tablet 0   amLODipine (NORVASC) 10 MG tablet TAKE ONE TABLET BY MOUTH EVERY DAY (Patient not taking: Reported on 05/30/2022) 90 tablet 1   No current facility-administered medications on file prior to visit.   Past Medical History:  Diagnosis Date   Alcohol abuse    Atherosclerosis of aorta (HCC)    Cognitive communication deficit    Essential hypertension    Mixed hyperlipidemia    Past Surgical History:  Procedure Laterality Date   CATARACT EXTRACTION Right 01/17/2022    Family History  Problem Relation Age of Onset   Stroke Mother    Heart failure Mother    Diabetes Mother    Stroke Father    Heart failure Father    Hypertension Father    Heart attack Father    Hyperlipidemia Other    Arrhythmia Other    Social History   Socioeconomic History   Marital status: Married    Spouse name: Not on file   Number of children: 3   Years of education: Not on file   Highest education level: Not on file  Occupational History   Occupation: retired  Tobacco Use   Smoking status: Former    Packs/day: 1.00    Types: Cigarettes    Quit date: 1994    Years since quitting: 29.8   Smokeless tobacco: Never  Substance and Sexual Activity   Alcohol use: Yes    Alcohol/week: 2.0 - 3.0 standard drinks of alcohol    Types: 2 - 3 Cans of beer per week   Drug use: Never   Sexual activity: Not on file  Other Topics Concern   Not on file  Social History Narrative   Not on file   Social  Determinants of Health   Financial Resource Strain: Not on file  Food Insecurity: No Food Insecurity (05/29/2022)   Hunger Vital Sign    Worried About Running Out of Food in the Last Year: Never true    Ran Out of Food in the Last Year: Never true  Transportation Needs: No Transportation Needs (05/29/2022)   PRAPARE - Hydrologist (Medical): No    Lack of Transportation (Non-Medical): No  Physical Activity: Inactive (06/29/2021)   Exercise Vital Sign    Days of Exercise per Week: 0 days    Minutes of Exercise per Session: 0 min  Stress: Not on file  Social Connections: Not on file    Review of Systems  Constitutional:  Negative for appetite change, fatigue and fever.  HENT:  Negative for congestion, ear pain, sinus pressure and sore throat.   Respiratory:  Negative for cough, chest tightness, shortness of breath and wheezing.   Cardiovascular:  Negative for chest pain and palpitations.  Gastrointestinal:  Positive for abdominal pain (intermittent Left sided pain). Negative for constipation, diarrhea, nausea and vomiting.  Genitourinary:  Negative for dysuria and hematuria.  Musculoskeletal:  Negative for arthralgias, back pain, joint swelling and myalgias.  Skin:  Negative for rash.  Neurological:  Negative for dizziness, weakness and headaches.  Psychiatric/Behavioral:  Negative for dysphoric mood. The patient is not nervous/anxious.      Objective:  BP (!) 140/70 (BP Location: Right Arm, Patient Position: Sitting)   Pulse 93   Temp 97.9 F (36.6 C) (Temporal)   Ht _0  (1.803 m)  Wt 178 lb (80.7 kg)   SpO2 98%   BMI 24.83 kg/m      05/30/2022    5:01 PM 05/30/2022    3:26 PM 05/24/2022    3:25 PM  BP/Weight  Systolic BP 270 350 093  Diastolic BP 70 76 76  Wt. (Lbs)  178 175  BMI  24.83 kg/m2 24.41 kg/m2    Physical Exam Vitals reviewed.  Constitutional:      Appearance: Normal appearance. He is normal weight.  Neck:      Vascular: No carotid bruit.  Cardiovascular:     Rate and Rhythm: Normal rate and regular rhythm.     Heart sounds: Normal heart sounds.  Pulmonary:     Effort: Pulmonary effort is normal.     Breath sounds: Normal breath sounds.  Abdominal:     General: Abdomen is flat. Bowel sounds are normal. There is distension.     Palpations: Abdomen is soft.  Musculoskeletal:     Comments: In wheelchair.  Legs weak. Difficulty getting up and transferring.   Neurological:     Mental Status: He is alert and oriented to person, place, and time.  Psychiatric:        Mood and Affect: Mood normal.        Behavior: Behavior normal.     Diabetic Foot Exam - Simple   No data filed      Lab Results  Component Value Date   WBC 9.9 05/29/2022   HGB 11.8 (L) 05/29/2022   HCT 33.0 (L) 05/29/2022   PLT 299 05/29/2022   GLUCOSE 140 (H) 05/29/2022   CHOL 158 01/25/2022   TRIG 59 01/25/2022   HDL 93 01/25/2022   LDLCALC 53 01/25/2022   ALT 75 (H) 05/29/2022   AST 67 (H) 05/29/2022   NA 135 05/29/2022   K 3.7 05/29/2022   CL 100 05/29/2022   CREATININE 0.89 05/29/2022   BUN 15 05/29/2022   CO2 21 05/29/2022   INR 1.1 05/24/2022   HGBA1C 5.3 01/25/2022      Assessment & Plan:   Problem List Items Addressed This Visit       Digestive   Alcoholic cirrhosis of liver with ascites ([place])    Patient has stopped drinking alcohol.  Stop cipro (no UTI) Hold amlodipine. Hold crestor.  Hold aspirin.  Continue spironolactone 100 mg daily.  Continue lasic 40 mg daily.   Start on alprazolam 0.25 mg twice daily as needed           Other   Anemia - Primary    Hold aspirin      Relevant Orders   B12 and Folate Panel   Other specified anxiety disorders    Start on alprazolam 0.25 mg twice daily as needed. Use sparingly. Specifically requested for upcoming paracentesis (diagnostic and hopefully therapeutic. )        Relevant Medications   ALPRAZolam (XANAX) 0.25 MG tablet   Need  for prophylactic vaccination against Streptococcus pneumoniae (pneumococcus) and influenza   Relevant Orders   Flu Vaccine QUAD High Dose(Fluad) (Completed)   Pneumococcal conjugate vaccine 20-valent (Prevnar 20) (Completed)  .  Meds ordered this encounter  Medications   ALPRAZolam (XANAX) 0.25 MG tablet    Sig: Take 1 tablet (0.25 mg total) by mouth 2 (two) times daily as needed for anxiety.    Dispense:  20 tablet    Refill:  0    Orders Placed This Encounter  Procedures   Flu Vaccine  QUAD High Dose(Fluad)   Pneumococcal conjugate vaccine 20-valent (Prevnar 20)   B12 and Folate Panel     Follow-up: Return in about 4 weeks (around 06/27/2022) for chronic follow up.  An After Visit Summary was printed and given to the patient.  Total time spent on today's visit was greater than 30 minutes, including both face-to-face time and nonface-to-face time personally spent on review of chart (labs and imaging), discussing labs and goals, discussing further work-up, treatment options, referrals to specialist if needed, reviewing outside records of pertinent, answering patient's questions, and coordinating care.   I,Lauren M Auman,acting as a scribe for Rochel Brome, MD.,have documented all relevant documentation on the behalf of Rochel Brome, MD,as directed by  Rochel Brome, MD while in the presence of Rochel Brome, MD.   Rochel Brome, MD [place] [phone]

## 2022-05-30 NOTE — Patient Instructions (Addendum)
Stop cipro. Hold amlodipine. Hold crestor.  Hold aspirin.  Continue spironolactone 100 mg daily.  Continue lasic 40 mg daily.    Start on alprazolam 0.25 mg twice daily as needed

## 2022-05-31 ENCOUNTER — Telehealth: Payer: Self-pay | Admitting: Family Medicine

## 2022-05-31 DIAGNOSIS — K7460 Unspecified cirrhosis of liver: Secondary | ICD-10-CM | POA: Diagnosis not present

## 2022-05-31 DIAGNOSIS — R188 Other ascites: Secondary | ICD-10-CM | POA: Diagnosis not present

## 2022-05-31 LAB — SPECIMEN STATUS REPORT

## 2022-05-31 LAB — B12 AND FOLATE PANEL
Folate: 9.4 ng/mL (ref >3.0)
Vitamin B-12: >2000 pg/mL — ABNORMAL HIGH (ref 232–1245)

## 2022-05-31 NOTE — Telephone Encounter (Signed)
PT DAUGHTER CALLED STATING Walter Joseph WENT VERY WELL. THE DOCTORS TOOK 2.6 LTS OFF OF HIS BODY. HE DOING VERY WELL AND IS ALREADY EATTING.

## 2022-05-31 NOTE — Telephone Encounter (Signed)
2.6LT PULLED OFF OF HIM - DOING GREAT PER DAUGHTER

## 2022-06-01 ENCOUNTER — Telehealth: Payer: Self-pay

## 2022-06-01 ENCOUNTER — Encounter: Payer: Self-pay | Admitting: Family Medicine

## 2022-06-01 NOTE — Telephone Encounter (Signed)
Walter Joseph called with questions about her Dad's medications.  Since he had the fluid drained yesterday she held his lasix last pm.  He did take his regular dose of the spirolactone and he has taken 1/2 of his amlodipine today.  She wanted to see if you think he should hold the lasix tonight or should he resume the lasix along with the spirolactone.

## 2022-06-04 DIAGNOSIS — K7031 Alcoholic cirrhosis of liver with ascites: Secondary | ICD-10-CM | POA: Insufficient documentation

## 2022-06-04 DIAGNOSIS — D649 Anemia, unspecified: Secondary | ICD-10-CM | POA: Insufficient documentation

## 2022-06-04 DIAGNOSIS — K7030 Alcoholic cirrhosis of liver without ascites: Secondary | ICD-10-CM | POA: Insufficient documentation

## 2022-06-04 DIAGNOSIS — F418 Other specified anxiety disorders: Secondary | ICD-10-CM | POA: Insufficient documentation

## 2022-06-04 DIAGNOSIS — Z23 Encounter for immunization: Secondary | ICD-10-CM | POA: Insufficient documentation

## 2022-06-04 NOTE — Assessment & Plan Note (Signed)
Patient has stopped drinking alcohol.  Stop cipro (no UTI) Hold amlodipine. Hold crestor.  Hold aspirin.  Continue spironolactone 100 mg daily.  Continue lasic 40 mg daily.   Start on alprazolam 0.25 mg twice daily as needed

## 2022-06-04 NOTE — Assessment & Plan Note (Signed)
Start on alprazolam 0.25 mg twice daily as needed. Use sparingly. Specifically requested for upcoming paracentesis (diagnostic and hopefully therapeutic. )

## 2022-06-04 NOTE — Assessment & Plan Note (Signed)
- 

## 2022-06-05 NOTE — Telephone Encounter (Signed)
Patient daughter made aware

## 2022-06-06 DIAGNOSIS — R932 Abnormal findings on diagnostic imaging of liver and biliary tract: Secondary | ICD-10-CM | POA: Diagnosis not present

## 2022-06-11 ENCOUNTER — Other Ambulatory Visit: Payer: Self-pay | Admitting: Family Medicine

## 2022-06-11 DIAGNOSIS — F418 Other specified anxiety disorders: Secondary | ICD-10-CM

## 2022-06-12 ENCOUNTER — Ambulatory Visit (INDEPENDENT_AMBULATORY_CARE_PROVIDER_SITE_OTHER): Payer: Medicare PPO

## 2022-06-12 DIAGNOSIS — Z23 Encounter for immunization: Secondary | ICD-10-CM

## 2022-06-12 NOTE — Telephone Encounter (Signed)
Patient is taking xanax 0.25 mg 2 tablet at nigh and it helps him to sleep. Her daughter asked if can you refill xanax but increase to 0.5 mg. Please advice.

## 2022-06-13 ENCOUNTER — Other Ambulatory Visit: Payer: Self-pay | Admitting: Family Medicine

## 2022-06-13 ENCOUNTER — Other Ambulatory Visit: Payer: Self-pay

## 2022-06-13 DIAGNOSIS — F418 Other specified anxiety disorders: Secondary | ICD-10-CM

## 2022-06-14 ENCOUNTER — Encounter: Payer: Self-pay | Admitting: Family Medicine

## 2022-06-14 ENCOUNTER — Other Ambulatory Visit: Payer: Self-pay

## 2022-06-14 DIAGNOSIS — F418 Other specified anxiety disorders: Secondary | ICD-10-CM

## 2022-06-14 MED ORDER — ALPRAZOLAM 0.25 MG PO TABS: 0.2500 mg | ORAL_TABLET | Freq: Two times a day (BID) | ORAL | 0 refills | Status: DC | PRN

## 2022-06-14 NOTE — Telephone Encounter (Signed)
Wife states if you do not want to refill xanax they would be ok with another rx for librium? A Xanax refill fine as long as you're willing to refill it.

## 2022-06-15 ENCOUNTER — Other Ambulatory Visit: Payer: Self-pay | Admitting: Family Medicine

## 2022-06-15 ENCOUNTER — Ambulatory Visit: Payer: Medicare PPO | Admitting: Family Medicine

## 2022-06-15 MED ORDER — ALPRAZOLAM 0.5 MG PO TABS: 0.5000 mg | ORAL_TABLET | Freq: Two times a day (BID) | ORAL | 2 refills | Status: DC | PRN

## 2022-06-16 ENCOUNTER — Telehealth: Payer: Self-pay

## 2022-06-16 DIAGNOSIS — K831 Obstruction of bile duct: Secondary | ICD-10-CM | POA: Diagnosis not present

## 2022-06-16 DIAGNOSIS — K7460 Unspecified cirrhosis of liver: Secondary | ICD-10-CM | POA: Diagnosis not present

## 2022-06-16 DIAGNOSIS — R188 Other ascites: Secondary | ICD-10-CM | POA: Diagnosis not present

## 2022-06-16 DIAGNOSIS — R29898 Other symptoms and signs involving the musculoskeletal system: Secondary | ICD-10-CM | POA: Diagnosis not present

## 2022-06-16 DIAGNOSIS — C240 Malignant neoplasm of extrahepatic bile duct: Secondary | ICD-10-CM | POA: Diagnosis not present

## 2022-06-16 NOTE — Telephone Encounter (Signed)
[facility] called and verbal was given for once for 2 weeks.

## 2022-06-20 DIAGNOSIS — K8070 Calculus of gallbladder and bile duct without cholecystitis without obstruction: Secondary | ICD-10-CM | POA: Diagnosis not present

## 2022-06-20 DIAGNOSIS — K766 Portal hypertension: Secondary | ICD-10-CM | POA: Diagnosis not present

## 2022-06-20 DIAGNOSIS — I8500 Esophageal varices without bleeding: Secondary | ICD-10-CM | POA: Diagnosis not present

## 2022-06-20 DIAGNOSIS — F1090 Alcohol use, unspecified, uncomplicated: Secondary | ICD-10-CM | POA: Diagnosis not present

## 2022-06-20 DIAGNOSIS — K7030 Alcoholic cirrhosis of liver without ascites: Secondary | ICD-10-CM | POA: Diagnosis not present

## 2022-06-21 ENCOUNTER — Other Ambulatory Visit: Payer: Self-pay | Admitting: Family Medicine

## 2022-06-21 DIAGNOSIS — Z4659 Encounter for fitting and adjustment of other gastrointestinal appliance and device: Secondary | ICD-10-CM | POA: Diagnosis not present

## 2022-06-21 DIAGNOSIS — C801 Malignant (primary) neoplasm, unspecified: Secondary | ICD-10-CM | POA: Diagnosis not present

## 2022-06-21 DIAGNOSIS — Z880 Allergy status to penicillin: Secondary | ICD-10-CM | POA: Diagnosis not present

## 2022-06-21 DIAGNOSIS — K8689 Other specified diseases of pancreas: Secondary | ICD-10-CM | POA: Diagnosis not present

## 2022-06-21 DIAGNOSIS — R935 Abnormal findings on diagnostic imaging of other abdominal regions, including retroperitoneum: Secondary | ICD-10-CM | POA: Diagnosis not present

## 2022-06-21 DIAGNOSIS — Z79899 Other long term (current) drug therapy: Secondary | ICD-10-CM | POA: Diagnosis not present

## 2022-06-21 DIAGNOSIS — I10 Essential (primary) hypertension: Secondary | ICD-10-CM | POA: Diagnosis not present

## 2022-06-21 DIAGNOSIS — K838 Other specified diseases of biliary tract: Secondary | ICD-10-CM | POA: Diagnosis not present

## 2022-06-21 DIAGNOSIS — R188 Other ascites: Secondary | ICD-10-CM | POA: Diagnosis not present

## 2022-06-21 DIAGNOSIS — R932 Abnormal findings on diagnostic imaging of liver and biliary tract: Secondary | ICD-10-CM | POA: Diagnosis not present

## 2022-06-21 DIAGNOSIS — Z87891 Personal history of nicotine dependence: Secondary | ICD-10-CM | POA: Diagnosis not present

## 2022-06-21 DIAGNOSIS — K831 Obstruction of bile duct: Secondary | ICD-10-CM | POA: Diagnosis not present

## 2022-06-21 DIAGNOSIS — Z881 Allergy status to other antibiotic agents status: Secondary | ICD-10-CM | POA: Diagnosis not present

## 2022-06-21 DIAGNOSIS — K869 Disease of pancreas, unspecified: Secondary | ICD-10-CM | POA: Diagnosis not present

## 2022-06-21 DIAGNOSIS — Z9689 Presence of other specified functional implants: Secondary | ICD-10-CM | POA: Diagnosis not present

## 2022-06-23 DIAGNOSIS — R188 Other ascites: Secondary | ICD-10-CM | POA: Diagnosis not present

## 2022-06-23 DIAGNOSIS — K8689 Other specified diseases of pancreas: Secondary | ICD-10-CM | POA: Diagnosis not present

## 2022-06-23 DIAGNOSIS — K7460 Unspecified cirrhosis of liver: Secondary | ICD-10-CM | POA: Diagnosis not present

## 2022-06-23 DIAGNOSIS — C249 Malignant neoplasm of biliary tract, unspecified: Secondary | ICD-10-CM | POA: Diagnosis not present

## 2022-06-23 DIAGNOSIS — K831 Obstruction of bile duct: Secondary | ICD-10-CM | POA: Diagnosis not present

## 2022-06-27 ENCOUNTER — Ambulatory Visit: Payer: Medicare PPO | Admitting: Family Medicine

## 2022-06-28 DIAGNOSIS — C259 Malignant neoplasm of pancreas, unspecified: Secondary | ICD-10-CM | POA: Insufficient documentation

## 2022-06-28 DIAGNOSIS — C250 Malignant neoplasm of head of pancreas: Secondary | ICD-10-CM | POA: Diagnosis not present

## 2022-06-28 DIAGNOSIS — G4700 Insomnia, unspecified: Secondary | ICD-10-CM | POA: Diagnosis not present

## 2022-06-28 DIAGNOSIS — R630 Anorexia: Secondary | ICD-10-CM | POA: Diagnosis not present

## 2022-06-29 ENCOUNTER — Encounter: Payer: Self-pay | Admitting: Family Medicine

## 2022-06-29 ENCOUNTER — Ambulatory Visit: Payer: Medicare PPO | Admitting: Family Medicine

## 2022-06-29 VITALS — BP 128/68 | HR 88 | Temp 97.9°F | Ht 71.0 in | Wt 147.8 lb

## 2022-06-29 DIAGNOSIS — I10 Essential (primary) hypertension: Secondary | ICD-10-CM

## 2022-06-29 DIAGNOSIS — G4709 Other insomnia: Secondary | ICD-10-CM

## 2022-06-29 DIAGNOSIS — E440 Moderate protein-calorie malnutrition: Secondary | ICD-10-CM | POA: Diagnosis not present

## 2022-06-29 DIAGNOSIS — R29898 Other symptoms and signs involving the musculoskeletal system: Secondary | ICD-10-CM

## 2022-06-29 DIAGNOSIS — I700 Atherosclerosis of aorta: Secondary | ICD-10-CM

## 2022-06-29 DIAGNOSIS — K7031 Alcoholic cirrhosis of liver with ascites: Secondary | ICD-10-CM

## 2022-06-29 DIAGNOSIS — E782 Mixed hyperlipidemia: Secondary | ICD-10-CM | POA: Diagnosis not present

## 2022-06-29 DIAGNOSIS — C250 Malignant neoplasm of head of pancreas: Secondary | ICD-10-CM | POA: Diagnosis not present

## 2022-06-29 DIAGNOSIS — F418 Other specified anxiety disorders: Secondary | ICD-10-CM | POA: Diagnosis not present

## 2022-06-29 NOTE — Progress Notes (Signed)
Subjective:  Patient ID: Walter Joseph, male    DOB: 08/04/1941  Age: 81 y.o. MRN: [id number]  Chief Complaint  Patient presents with   Anemia    4 week follow up   HPI:  Patient is an 81 year old white male with past medical history of hypertension, hyperlipidemia, liver cirrhosis with ascites, and most recently diagnosed with pancreatic cancer.  Patient presents with his daughter Vira Blanco, who is a PA.  Patient has seen [facility] oncology, Dr. Fanny Skates. Discussing chemo. Plans to start 2 chemo medicines in December, which he will have to receive every 2 weeks.  Patient cancer antigen 19-9 is elevated to 163, anemia has improved, liver enzymes have improved although bilirubin is still mildly elevated.  Patient no longer has jaundice/icterus and has not had any feelings of encephalopathy.  Patient has ascites, which is much better s/p paracentesis.  Patient is currently on spironolactone 100 mg and Lasix 40 mg each half pill daily.  If his weight increases by 2 pounds in 24 hours, patient was recommended to increase to 1 pill of each. Patient has had decreased appetite and poor sleep.  He has been taking the Xanax 0.5 mg 1 p.o. nightly for his insomnia.  Oncologist just started Remeron 15 mg nightly.    Current Outpatient Medications on File Prior to Visit  Medication Sig Dispense Refill   ALPRAZolam (XANAX) 0.5 MG tablet Take 1 tablet (0.5 mg total) by mouth 2 (two) times daily as needed for anxiety. 30 tablet 2   amLODipine (NORVASC) 10 MG tablet TAKE ONE TABLET BY MOUTH EVERY DAY (Patient taking differently: Take 5 mg by mouth daily.) 90 tablet 1   B Complex Vitamins (B COMPLEX PO) Take 1 tablet by mouth daily.     Ca Phosphate-Cholecalciferol (CALCIUM WITH D3 PO) Take by mouth.     Cholecalciferol 50 MCG (2000 UT) TABS Take 2,000 tablets by mouth daily.     esomeprazole (NEXIUM) 20 MG capsule Take 20 mg by mouth in the morning and at bedtime.     furosemide (LASIX) 40 MG tablet Take 1 tablet (40 mg  total) by mouth daily. (Patient taking differently: Taking 1 to 1/2 tablet daily according to weight.) 30 tablet 3   REMERON 15 MG tablet Take 15 mg by mouth at bedtime.     spironolactone (ALDACTONE) 100 MG tablet Take 1 tablet (100 mg total) by mouth daily. (Patient taking differently: Taking 1 to 1/2 tablet daily according to weight.) 30 tablet 0   No current facility-administered medications on file prior to visit.   Past Medical History:  Diagnosis Date   Alcohol abuse    Atherosclerosis of aorta (HCC)    Cognitive communication deficit    Essential hypertension    Icterus 05/24/2022   Jaundice 05/24/2022   Mixed hyperlipidemia    Past Surgical History:  Procedure Laterality Date   CATARACT EXTRACTION Right 01/17/2022    Family History  Problem Relation Age of Onset   Stroke Mother    Heart failure Mother    Diabetes Mother    Stroke Father    Heart failure Father    Hypertension Father    Heart attack Father    Hyperlipidemia Other    Arrhythmia Other    Social History   Socioeconomic History   Marital status: Married    Spouse name: Not on file   Number of children: 3   Years of education: Not on file   Highest education level: Not on file  Occupational History   Occupation: retired  Tobacco Use   Smoking status: Former    Packs/day: 1.00    Types: Cigarettes    Quit date: 1994    Years since quitting: 29.9   Smokeless tobacco: Never  Substance and Sexual Activity   Alcohol use: Yes    Alcohol/week: 2.0 - 3.0 standard drinks of alcohol    Types: 2 - 3 Cans of beer per week   Drug use: Never   Sexual activity: Not on file  Other Topics Concern   Not on file  Social History Narrative   Not on file   Social Determinants of Health   Financial Resource Strain: Not on file  Food Insecurity: No Food Insecurity (05/29/2022)   Hunger Vital Sign    Worried About Running Out of Food in the Last Year: Never true    Ran Out of Food in the Last Year: Never  true  Transportation Needs: No Transportation Needs (05/29/2022)   PRAPARE - Hydrologist (Medical): No    Lack of Transportation (Non-Medical): No  Physical Activity: Inactive (06/29/2021)   Exercise Vital Sign    Days of Exercise per Week: 0 days    Minutes of Exercise per Session: 0 min  Stress: Not on file  Social Connections: Not on file    Review of Systems  Constitutional:  Positive for appetite change and fatigue. Negative for fever.  HENT:  Negative for congestion, ear pain, sinus pressure and sore throat.   Respiratory:  Negative for cough, chest tightness, shortness of breath and wheezing.   Cardiovascular:  Negative for chest pain and palpitations.  Gastrointestinal:  Negative for abdominal pain, constipation, diarrhea, nausea and vomiting.  Genitourinary:  Negative for dysuria and hematuria.  Musculoskeletal:  Negative for arthralgias, back pain, joint swelling and myalgias.  Skin:  Negative for rash.  Neurological:  Positive for weakness. Negative for dizziness and headaches.  Psychiatric/Behavioral:  Negative for dysphoric mood. The patient is not nervous/anxious.      Objective:  BP 128/68 (BP Location: Left Arm, Patient Position: Sitting)   Pulse 88   Temp 97.9 F (36.6 C) (Temporal)   Ht '5\' 11"'$  (1.803 m)   Wt 147 lb 12.8 oz (67 kg)   SpO2 99%   BMI 20.61 kg/m      06/29/2022    1:56 PM 06/29/2022    1:43 PM 05/30/2022    5:01 PM  BP/Weight  Systolic BP 938 182 993  Diastolic BP 68 72 70  Wt. (Lbs)  147.8   BMI  20.61 kg/m2     Physical Exam Vitals reviewed.  Constitutional:      Appearance: Normal appearance. He is normal weight.  Eyes:     General: No scleral icterus. Cardiovascular:     Rate and Rhythm: Normal rate and regular rhythm.     Heart sounds: Normal heart sounds.  Pulmonary:     Effort: Pulmonary effort is normal.     Breath sounds: Normal breath sounds.  Abdominal:     General: Abdomen is flat. Bowel  sounds are normal.     Palpations: Abdomen is soft.     Tenderness: There is no abdominal tenderness.  Musculoskeletal:     Right lower leg: No edema.     Left lower leg: No edema.  Skin:    Coloration: Skin is not jaundiced.  Neurological:     Mental Status: He is alert and oriented to person, place, and  time.  Psychiatric:        Mood and Affect: Mood normal.        Behavior: Behavior normal.     Diabetic Foot Exam - Simple   No data filed      Lab Results  Component Value Date   WBC 9.9 05/29/2022   HGB 11.8 (L) 05/29/2022   HCT 33.0 (L) 05/29/2022   PLT 299 05/29/2022   GLUCOSE 140 (H) 05/29/2022   CHOL 158 01/25/2022   TRIG 59 01/25/2022   HDL 93 01/25/2022   LDLCALC 53 01/25/2022   ALT 75 (H) 05/29/2022   AST 67 (H) 05/29/2022   NA 135 05/29/2022   K 3.7 05/29/2022   CL 100 05/29/2022   CREATININE 0.89 05/29/2022   BUN 15 05/29/2022   CO2 21 05/29/2022   INR 1.1 05/24/2022   HGBA1C 5.3 01/25/2022      Assessment & Plan:   Problem List Items Addressed This Visit       Cardiovascular and Mediastinum   Essential hypertension - Primary    Well controlled.  No changes to medicines. Continue amlodipine 10 mg daily.      Atherosclerosis of aorta ([place])    No treatment with statin.         Digestive   Alcoholic cirrhosis of liver with ascites (HCC)    Ascites has significantly improved.  If recurs, may need paracentesis. No alcohol.      Pancreatic cancer [facility])    Management per [facility].  I do recommend patient get TDAP and Shingrix vaccines at the pharmacy.          Other   Mixed hyperlipidemia    Remain of lipitor.  Due to new diagnosis of cancer treating hyperlipidemia aggressively is not appropriate.       Weakness of both legs    Recommend continue physical therapy.       Other insomnia    Start on remeron 15 mg before bed.  Only take xanax if unable to sleep with remeron.      Other specified anxiety disorders    Has  xanax to take as needed in situations like going to [facility] for visit with oncology.  May use at night if remeron does not help him sleep.       Relevant Medications   REMERON 15 MG tablet   Moderate protein-calorie malnutrition ([place])    Strongly recommend high protein diet.  Drink protein drinks if unable to eat.       .   Follow-up: Return in about 3 months (around 09/29/2022).  An After Visit Summary was printed and given to the patient.  Rochel Brome, MD [facility] [phone]

## 2022-07-02 ENCOUNTER — Encounter: Payer: Self-pay | Admitting: Family Medicine

## 2022-07-02 DIAGNOSIS — E440 Moderate protein-calorie malnutrition: Secondary | ICD-10-CM | POA: Insufficient documentation

## 2022-07-02 DIAGNOSIS — E43 Unspecified severe protein-calorie malnutrition: Secondary | ICD-10-CM | POA: Insufficient documentation

## 2022-07-02 NOTE — Assessment & Plan Note (Signed)
Remain of lipitor.  Due to new diagnosis of cancer treating hyperlipidemia aggressively is not appropriate.

## 2022-07-02 NOTE — Assessment & Plan Note (Signed)
No treatment with statin.

## 2022-07-02 NOTE — Assessment & Plan Note (Signed)
Start on remeron 15 mg before bed.  Only take xanax if unable to sleep with remeron.

## 2022-07-02 NOTE — Assessment & Plan Note (Signed)
Recommend continue physical therapy.

## 2022-07-02 NOTE — Assessment & Plan Note (Signed)
Has xanax to take as needed in situations like going to [facility] for visit with oncology.  May use at night if remeron does not help him sleep.  

## 2022-07-02 NOTE — Assessment & Plan Note (Signed)
Well controlled.  No changes to medicines. Continue amlodipine 10 mg daily.

## 2022-07-02 NOTE — Assessment & Plan Note (Addendum)
Management per [facility] oncology.  I do recommend patient get TDAP and Shingrix vaccines at the pharmacy.

## 2022-07-02 NOTE — Assessment & Plan Note (Signed)
Ascites has significantly improved.  If recurs, may need paracentesis. No alcohol.

## 2022-07-02 NOTE — Assessment & Plan Note (Signed)
Strongly recommend high protein diet.  Drink protein drinks if unable to eat.

## 2022-07-17 ENCOUNTER — Other Ambulatory Visit: Payer: Self-pay

## 2022-07-17 MED ORDER — ALPRAZOLAM 0.5 MG PO TABS: 0.5000 mg | ORAL_TABLET | Freq: Two times a day (BID) | ORAL | 2 refills | Status: DC | PRN

## 2022-07-24 DIAGNOSIS — G4700 Insomnia, unspecified: Secondary | ICD-10-CM | POA: Diagnosis not present

## 2022-07-24 DIAGNOSIS — R5381 Other malaise: Secondary | ICD-10-CM | POA: Diagnosis not present

## 2022-07-24 DIAGNOSIS — R630 Anorexia: Secondary | ICD-10-CM | POA: Diagnosis not present

## 2022-07-24 DIAGNOSIS — C250 Malignant neoplasm of head of pancreas: Secondary | ICD-10-CM | POA: Diagnosis not present

## 2022-07-31 ENCOUNTER — Encounter: Payer: Medicare PPO | Admitting: Family Medicine

## 2022-08-02 DIAGNOSIS — K8070 Calculus of gallbladder and bile duct without cholecystitis without obstruction: Secondary | ICD-10-CM | POA: Diagnosis not present

## 2022-08-02 DIAGNOSIS — K766 Portal hypertension: Secondary | ICD-10-CM | POA: Diagnosis not present

## 2022-08-02 DIAGNOSIS — I8500 Esophageal varices without bleeding: Secondary | ICD-10-CM | POA: Diagnosis not present

## 2022-08-02 DIAGNOSIS — F1090 Alcohol use, unspecified, uncomplicated: Secondary | ICD-10-CM | POA: Diagnosis not present

## 2022-08-02 DIAGNOSIS — K7030 Alcoholic cirrhosis of liver without ascites: Secondary | ICD-10-CM | POA: Diagnosis not present

## 2022-08-18 DIAGNOSIS — K7689 Other specified diseases of liver: Secondary | ICD-10-CM | POA: Diagnosis not present

## 2022-08-18 DIAGNOSIS — C250 Malignant neoplasm of head of pancreas: Secondary | ICD-10-CM | POA: Diagnosis not present

## 2022-08-23 DIAGNOSIS — G4700 Insomnia, unspecified: Secondary | ICD-10-CM | POA: Diagnosis not present

## 2022-08-23 DIAGNOSIS — C259 Malignant neoplasm of pancreas, unspecified: Secondary | ICD-10-CM | POA: Diagnosis not present

## 2022-08-23 DIAGNOSIS — I10 Essential (primary) hypertension: Secondary | ICD-10-CM | POA: Diagnosis not present

## 2022-08-23 DIAGNOSIS — R188 Other ascites: Secondary | ICD-10-CM | POA: Diagnosis not present

## 2022-08-23 DIAGNOSIS — K7460 Unspecified cirrhosis of liver: Secondary | ICD-10-CM | POA: Diagnosis not present

## 2022-08-29 DIAGNOSIS — K766 Portal hypertension: Secondary | ICD-10-CM | POA: Diagnosis not present

## 2022-08-29 DIAGNOSIS — I8500 Esophageal varices without bleeding: Secondary | ICD-10-CM | POA: Diagnosis not present

## 2022-08-29 DIAGNOSIS — F1090 Alcohol use, unspecified, uncomplicated: Secondary | ICD-10-CM | POA: Diagnosis not present

## 2022-08-29 DIAGNOSIS — K7030 Alcoholic cirrhosis of liver without ascites: Secondary | ICD-10-CM | POA: Diagnosis not present

## 2022-09-09 DIAGNOSIS — G8929 Other chronic pain: Secondary | ICD-10-CM | POA: Diagnosis not present

## 2022-09-12 ENCOUNTER — Other Ambulatory Visit: Payer: Self-pay | Admitting: Family Medicine

## 2022-09-12 DIAGNOSIS — R188 Other ascites: Secondary | ICD-10-CM

## 2022-09-29 ENCOUNTER — Encounter: Payer: Self-pay | Admitting: Family Medicine

## 2022-10-02 ENCOUNTER — Ambulatory Visit: Payer: Medicare PPO | Admitting: Family Medicine

## 2022-10-02 ENCOUNTER — Encounter: Payer: Self-pay | Admitting: Family Medicine

## 2022-10-02 VITALS — BP 132/68 | HR 80 | Temp 97.3°F | Ht 71.0 in | Wt 153.2 lb

## 2022-10-02 DIAGNOSIS — G4709 Other insomnia: Secondary | ICD-10-CM | POA: Diagnosis not present

## 2022-10-02 DIAGNOSIS — E782 Mixed hyperlipidemia: Secondary | ICD-10-CM | POA: Diagnosis not present

## 2022-10-02 DIAGNOSIS — I700 Atherosclerosis of aorta: Secondary | ICD-10-CM

## 2022-10-02 DIAGNOSIS — C250 Malignant neoplasm of head of pancreas: Secondary | ICD-10-CM

## 2022-10-02 DIAGNOSIS — E440 Moderate protein-calorie malnutrition: Secondary | ICD-10-CM | POA: Diagnosis not present

## 2022-10-02 DIAGNOSIS — I10 Essential (primary) hypertension: Secondary | ICD-10-CM

## 2022-10-02 DIAGNOSIS — K7031 Alcoholic cirrhosis of liver with ascites: Secondary | ICD-10-CM | POA: Diagnosis not present

## 2022-10-02 NOTE — Assessment & Plan Note (Signed)
Has xanax to take as needed in situations like going to [facility] for visit with oncology.  May use at night if remeron does not help him sleep.

## 2022-10-02 NOTE — Assessment & Plan Note (Addendum)
Strongly recommend high protein diet.  Drink protein drinks if unable to eat.  Check vitamin D level.

## 2022-10-02 NOTE — Assessment & Plan Note (Addendum)
Greatly improved.  Continue spironolactone and Lasix. Continue to abstain from alcohol use.

## 2022-10-02 NOTE — Assessment & Plan Note (Addendum)
No treatment with statin due to elevated liver functions and cirrhosis.

## 2022-10-02 NOTE — Assessment & Plan Note (Addendum)
Well controlled.  No changes to medicines.  Continue amlodipine 5 mg daily.

## 2022-10-02 NOTE — Assessment & Plan Note (Addendum)
Continue remeron 15 mg before bed.  Only take xanax if unable to sleep with remeron.  Recommend use sparingly.

## 2022-10-02 NOTE — Progress Notes (Signed)
Subjective:  Patient ID: Walter Joseph, male    DOB: [birth year]  Age: 82 y.o. MRN: [id number]  Chief Complaint  Patient presents with   Hyperlipidemia   Hypertension   HPI: Patient is an 82 year old white male with past medical history of hypertension, hyperlipidemia, liver cirrhosis with ascites, and pancreatic cancer.  Patient is currently seeing [facility] oncology, Dr. Fanny Skates. Discussed chemo and does not feel like the patient needs it at this time.Patient is scheduled for CT scan on March 4th and scheduled to see them March 7th.  Patient cancer antigen 19-9 increased from 163 up to 3 26.  Blood count and chemistry panel have returned to normal.  Patient has not drank any alcohol since he was diagnosed.    Alcoholic liver cirrhosis: No alcohol.  Liver enzymes and blood count returned to normal.  Patient is currently on spironolactone 100 mg to 1 tablet daily based on weight and Lasix 40 mg each half pill daily.    Insomnia:He has been taking the Xanax 0.5 mg 1 p.o. nightly and Remeron 15 mg nightly.   Hypertension: Patient is currently taking Amlodipine 5 mg daily.  Needs a new prescription.  Calorie malnutrition: This has significantly improved.  Patient's appetite has improved.  His daughter, who is a PA requested we check his vitamin D level.     10/02/2022    2:09 PM 06/29/2022    2:03 PM 06/29/2022    2:00 PM 06/30/2021    2:47 PM 06/29/2021    3:20 PM  Depression screen PHQ 2/9  Decreased Interest 0 0 0 0 0  Down, Depressed, Hopeless 0 0 0 0 0  PHQ - 2 Score 0 0 0 0 0  Altered sleeping 0 0 0    Tired, decreased energy 0 0 0    Change in appetite 0 1 1    Feeling bad or failure about yourself  0 0 0    Trouble concentrating 0 0 0    Moving slowly or fidgety/restless 0 0 0    Suicidal thoughts 0 0 0    PHQ-9 Score 0 1 1    Difficult doing work/chores Not difficult at all Not difficult at all            06/18/2020    7:42 AM 12/17/2020    8:09 AM 06/29/2021    3:21 PM 06/30/2021     2:47 PM 10/02/2022    2:13 PM  Fall Risk  Falls in the past year? 0 0 0 0 1  Was there an injury with Fall? 0 0 0 0 0  Fall Risk Category Calculator 0 0 0 0 2  Fall Risk Category (Retired) Low Low Low Low   (RETIRED) Patient Fall Risk Level Low fall risk Low fall risk Low fall risk Low fall risk   Patient at Risk for Falls Due to Impaired vision No Fall Risks History of fall(s) No Fall Risks No Fall Risks  Fall risk Follow up Falls evaluation completed Falls evaluation completed Falls evaluation completed;Falls prevention discussed Falls evaluation completed Falls evaluation completed;Education provided      Review of Systems  Constitutional:  Negative for appetite change, fatigue and fever.  HENT:  Negative for congestion, ear pain, sinus pressure and sore throat.   Respiratory:  Negative for cough, chest tightness, shortness of breath and wheezing.   Cardiovascular:  Negative for chest pain and palpitations.  Gastrointestinal:  Negative for abdominal pain, constipation, diarrhea, nausea and vomiting.  Genitourinary:  Negative for dysuria and hematuria.  Musculoskeletal:  Negative for arthralgias, back pain, joint swelling and myalgias.  Skin:  Negative for rash.  Neurological:  Negative for dizziness, weakness and headaches.  Psychiatric/Behavioral:  Negative for dysphoric mood. The patient is not nervous/anxious.     Current Outpatient Medications on File Prior to Visit  Medication Sig Dispense Refill   ALPRAZolam (XANAX) 0.5 MG tablet Take 1 tablet (0.5 mg total) by mouth 2 (two) times daily as needed for anxiety. 30 tablet 2   B Complex Vitamins (B COMPLEX PO) Take 1 tablet by mouth daily.     Ca Phosphate-Cholecalciferol (CALCIUM WITH D3 PO) Take by mouth.     Cholecalciferol 50 MCG (2000 UT) TABS Take 2,000 tablets by mouth daily.     esomeprazole (NEXIUM) 20 MG capsule Take 20 mg by mouth in the morning and at bedtime.     furosemide (LASIX) 40 MG tablet Take 1 tablet (40  mg total) by mouth daily. 30 tablet 3   REMERON 15 MG tablet Take 15 mg by mouth at bedtime.     spironolactone (ALDACTONE) 100 MG tablet Take 1 tablet (100 mg total) by mouth daily. (Patient taking differently: Taking 1 to 1/2 tablet daily according to weight.) 30 tablet 0   amLODipine (NORVASC) 5 MG tablet Take 5 mg by mouth daily.     No current facility-administered medications on file prior to visit.   Past Medical History:  Diagnosis Date   Alcohol abuse    Atherosclerosis of aorta (HCC)    Cognitive communication deficit    Essential hypertension    Icterus 05/24/2022   Jaundice 05/24/2022   Mixed hyperlipidemia    Past Surgical History:  Procedure Laterality Date   CATARACT EXTRACTION Right 01/17/2022    Family History  Problem Relation Age of Onset   Stroke Mother    Heart failure Mother    Diabetes Mother    Stroke Father    Heart failure Father    Hypertension Father    Heart attack Father    Hyperlipidemia Other    Arrhythmia Other    Social History   Socioeconomic History   Marital status: Married    Spouse name: Not on file   Number of children: 3   Years of education: Not on file   Highest education level: Not on file  Occupational History   Occupation: retired  Tobacco Use   Smoking status: Former    Packs/day: 1.00    Types: Cigarettes    Quit date: 1994    Years since quitting: 30.1   Smokeless tobacco: Never  Substance and Sexual Activity   Alcohol use: Yes    Alcohol/week: 2.0 - 3.0 standard drinks of alcohol    Types: 2 - 3 Cans of beer per week   Drug use: Never   Sexual activity: Not on file  Other Topics Concern   Not on file  Social History Narrative   Not on file   Social Determinants of Health   Financial Resource Strain: Not on file  Food Insecurity: No Food Insecurity (05/29/2022)   Hunger Vital Sign    Worried About Running Out of Food in the Last Year: Never true    Ran Out of Food in the Last Year: Never true   Transportation Needs: No Transportation Needs (05/29/2022)   PRAPARE - Hydrologist (Medical): No    Lack of Transportation (Non-Medical): No  Physical Activity: Inactive (06/29/2021)  Exercise Vital Sign    Days of Exercise per Week: 0 days    Minutes of Exercise per Session: 0 min  Stress: Not on file  Social Connections: Not on file    Objective:  BP 132/68 (BP Location: Left Arm, Patient Position: Sitting)   Pulse 80   Temp (!) 97.3 F (36.3 C) (Temporal)   Ht 5' 11"$  (1.803 m)   Wt 153 lb 3.2 oz (69.5 kg)   SpO2 98%   BMI 21.37 kg/m      10/02/2022    2:31 PM 10/02/2022    2:20 PM 06/29/2022    1:56 PM  BP/Weight  Systolic BP [id number]  Diastolic BP 68 78 68  Wt. (Lbs)  153.2   BMI  21.37 kg/m2     Physical Exam Vitals reviewed.  Constitutional:      Appearance: Normal appearance.     Comments: Patient is still very thin.  His weight has improved.  Neck:     Vascular: No carotid bruit.  Cardiovascular:     Rate and Rhythm: Normal rate and regular rhythm.     Heart sounds: Normal heart sounds.  Pulmonary:     Effort: Pulmonary effort is normal.     Breath sounds: Normal breath sounds.  Abdominal:     General: Abdomen is flat. Bowel sounds are normal. There is no distension.     Palpations: Abdomen is soft.     Tenderness: There is no abdominal tenderness.     Comments: No hepatomegaly noted  Neurological:     Mental Status: He is alert and oriented to person, place, and time.  Psychiatric:        Mood and Affect: Mood normal.        Behavior: Behavior normal.     Diabetic Foot Exam - Simple   No data filed      Lab Results  Component Value Date   WBC 9.9 05/29/2022   HGB 11.8 (L) 05/29/2022   HCT 33.0 (L) 05/29/2022   PLT 299 05/29/2022   GLUCOSE 140 (H) 05/29/2022   CHOL 158 01/25/2022   TRIG 59 01/25/2022   HDL 93 01/25/2022   LDLCALC 53 01/25/2022   ALT 75 (H) 05/29/2022   AST 67 (H) 05/29/2022   NA 135  05/29/2022   K 3.7 05/29/2022   CL 100 05/29/2022   CREATININE 0.89 05/29/2022   BUN 15 05/29/2022   CO2 21 05/29/2022   INR 1.1 05/24/2022   HGBA1C 5.3 01/25/2022      Assessment & Plan:    Essential hypertension Assessment & Plan: Well controlled.  No changes to medicines.  Continue amlodipine 5 mg daily.   Mixed hyperlipidemia Assessment & Plan: Remain off lipitor.  Due to new diagnosis of cancer treating hyperlipidemia aggressively is not appropriate.    Alcoholic cirrhosis of liver with ascites ([place]) Assessment & Plan: Greatly improved.  Continue spironolactone and Lasix. Continue to abstain from alcohol use.   Other insomnia Assessment & Plan: Continue remeron 15 mg before bed.  Only take xanax if unable to sleep with remeron.  Recommend use sparingly.   Malignant neoplasm of head of pancreas ([place])  Atherosclerosis of aorta ([place]) Assessment & Plan: No treatment with statin due to elevated liver functions and cirrhosis.   Moderate protein-calorie malnutrition ([place]) Assessment & Plan: Strongly recommend high protein diet.  Drink protein drinks if unable to eat.  Check vitamin D level.  Orders: -     VITAMIN  D 25 Hydroxy (Vit-D Deficiency, Fractures)     No orders of the defined types were placed in this encounter.   Orders Placed This Encounter  Procedures   VITAMIN D 25 Hydroxy (Vit-D Deficiency, Fractures)     Follow-up: Return in about 4 months (around 01/31/2023) for Fasting, chronic.  Total time spent on today's visit was greater than 30 minutes, including both face-to-face time and nonface-to-face time personally spent on review of chart (labs and imaging), discussing labs and goals, discussing further work-up, treatment options, referrals to specialist if needed, reviewing outside records of pertinent, answering patient's questions, and coordinating care.   I,Lauren M Auman,acting as a scribe for Rochel Brome, MD.,have documented all  relevant documentation on the behalf of Rochel Brome, MD,as directed by  Rochel Brome, MD while in the presence of Rochel Brome, MD.   An After Visit Summary was printed and given to the patient.  I attest that I have reviewed this visit and agree with the plan scribed by my staff.   Rochel Brome, MD [facility] [phone]

## 2022-10-02 NOTE — Assessment & Plan Note (Addendum)
Remain off lipitor.  Due to new diagnosis of cancer treating hyperlipidemia aggressively is not appropriate.

## 2022-10-03 ENCOUNTER — Other Ambulatory Visit: Payer: Self-pay

## 2022-10-03 DIAGNOSIS — R188 Other ascites: Secondary | ICD-10-CM

## 2022-10-03 LAB — VITAMIN D 25 HYDROXY (VIT D DEFICIENCY, FRACTURES): Vit D, 25-Hydroxy: 29.7 ng/mL — ABNORMAL LOW (ref 30.0–100.0)

## 2022-10-04 ENCOUNTER — Other Ambulatory Visit: Payer: Self-pay

## 2022-10-04 MED ORDER — ERGOCALCIFEROL 1.25 MG (50000 UT) PO CAPS: 50000.0000 [IU] | ORAL_CAPSULE | ORAL | 0 refills | Status: DC

## 2022-10-04 MED ORDER — SPIRONOLACTONE 100 MG PO TABS: 100.0000 mg | ORAL_TABLET | Freq: Every day | ORAL | 1 refills | Status: DC

## 2022-10-05 ENCOUNTER — Other Ambulatory Visit: Payer: Self-pay | Admitting: Family Medicine

## 2022-10-05 NOTE — Telephone Encounter (Signed)
Sent. Dr. Tobie Poet

## 2022-10-10 DIAGNOSIS — G8929 Other chronic pain: Secondary | ICD-10-CM | POA: Diagnosis not present

## 2022-10-23 DIAGNOSIS — Z0189 Encounter for other specified special examinations: Secondary | ICD-10-CM | POA: Diagnosis not present

## 2022-10-23 DIAGNOSIS — K769 Liver disease, unspecified: Secondary | ICD-10-CM | POA: Diagnosis not present

## 2022-10-23 DIAGNOSIS — C259 Malignant neoplasm of pancreas, unspecified: Secondary | ICD-10-CM | POA: Diagnosis not present

## 2022-10-26 DIAGNOSIS — C259 Malignant neoplasm of pancreas, unspecified: Secondary | ICD-10-CM | POA: Diagnosis not present

## 2022-11-08 DIAGNOSIS — G8929 Other chronic pain: Secondary | ICD-10-CM | POA: Diagnosis not present

## 2022-12-06 ENCOUNTER — Telehealth: Payer: Self-pay

## 2022-12-06 NOTE — Telephone Encounter (Signed)
Appointment has been rescheduled.

## 2022-12-06 NOTE — Telephone Encounter (Signed)
I left a message on the number(s) listed in the patients chart requesting the patient to call back regarding the upcomming appointment for 02/01/2023. The provider is out of the office that day. The appointment has been canceled. Waiting for the patient to return the call.

## 2022-12-09 DIAGNOSIS — G8929 Other chronic pain: Secondary | ICD-10-CM | POA: Diagnosis not present

## 2023-01-01 ENCOUNTER — Other Ambulatory Visit: Payer: Self-pay | Admitting: Family Medicine

## 2023-01-01 DIAGNOSIS — C250 Malignant neoplasm of head of pancreas: Secondary | ICD-10-CM | POA: Diagnosis not present

## 2023-01-01 DIAGNOSIS — R590 Localized enlarged lymph nodes: Secondary | ICD-10-CM | POA: Diagnosis not present

## 2023-01-01 DIAGNOSIS — C259 Malignant neoplasm of pancreas, unspecified: Secondary | ICD-10-CM | POA: Diagnosis not present

## 2023-01-01 DIAGNOSIS — C787 Secondary malignant neoplasm of liver and intrahepatic bile duct: Secondary | ICD-10-CM | POA: Diagnosis not present

## 2023-01-01 DIAGNOSIS — Z0189 Encounter for other specified special examinations: Secondary | ICD-10-CM | POA: Diagnosis not present

## 2023-01-02 DIAGNOSIS — C259 Malignant neoplasm of pancreas, unspecified: Secondary | ICD-10-CM | POA: Diagnosis not present

## 2023-01-03 ENCOUNTER — Other Ambulatory Visit: Payer: Self-pay | Admitting: Family Medicine

## 2023-01-03 DIAGNOSIS — R188 Other ascites: Secondary | ICD-10-CM

## 2023-01-08 DIAGNOSIS — G8929 Other chronic pain: Secondary | ICD-10-CM | POA: Diagnosis not present

## 2023-01-11 ENCOUNTER — Encounter: Payer: Self-pay | Admitting: Family Medicine

## 2023-01-16 ENCOUNTER — Ambulatory Visit (INDEPENDENT_AMBULATORY_CARE_PROVIDER_SITE_OTHER): Payer: Medicare PPO | Admitting: Family Medicine

## 2023-01-16 ENCOUNTER — Other Ambulatory Visit: Payer: Self-pay | Admitting: Family Medicine

## 2023-01-16 VITALS — BP 132/70 | HR 87 | Temp 97.3°F | Ht 71.0 in | Wt 137.0 lb

## 2023-01-16 DIAGNOSIS — C259 Malignant neoplasm of pancreas, unspecified: Secondary | ICD-10-CM

## 2023-01-16 DIAGNOSIS — K7030 Alcoholic cirrhosis of liver without ascites: Secondary | ICD-10-CM | POA: Diagnosis not present

## 2023-01-16 DIAGNOSIS — R531 Weakness: Secondary | ICD-10-CM | POA: Diagnosis not present

## 2023-01-16 DIAGNOSIS — E43 Unspecified severe protein-calorie malnutrition: Secondary | ICD-10-CM

## 2023-01-16 DIAGNOSIS — R17 Unspecified jaundice: Secondary | ICD-10-CM

## 2023-01-16 DIAGNOSIS — K7460 Unspecified cirrhosis of liver: Secondary | ICD-10-CM | POA: Diagnosis not present

## 2023-01-16 LAB — COMPREHENSIVE METABOLIC PANEL
Albumin: 4.1 (ref 3.5–5.0)
Calcium: 9.7 (ref 8.7–10.7)
eGFR: >60

## 2023-01-16 LAB — CBC: RBC: 4.52 (ref 3.87–5.11)

## 2023-01-16 LAB — BASIC METABOLIC PANEL
BUN: 14 (ref 4–21)
CO2: 17 (ref 13–22)
Chloride: 106 (ref 99–108)
Creatinine: 0.7 (ref 0.6–1.3)
Glucose: 125
Potassium: 3.9 mEq/L (ref 3.5–5.1)
Sodium: 139 (ref 137–147)

## 2023-01-16 LAB — HEPATIC FUNCTION PANEL
ALT: 155 U/L — AB (ref 10–40)
AST: 95 — AB (ref 14–40)
Alkaline Phosphatase: 465 — AB (ref 25–125)
Bilirubin, Total: 10.8

## 2023-01-16 LAB — CBC AND DIFFERENTIAL
HCT: 42 (ref 41–53)
Hemoglobin: 14 (ref 13.5–17.5)
Neutrophils Absolute: 5.76
Platelets: 239 10*3/uL (ref 150–400)
WBC: 8.6

## 2023-01-16 NOTE — Progress Notes (Unsigned)
Subjective:  Patient ID: Walter Joseph, male    DOB: [birth year]  Age: 82 y.o. MRN: [id number]  Chief Complaint  Patient presents with   Jaundice    HPI Patient is an 82 yo male with pmhx of alcoholic liver cirrhosis, hypertension, hyperlipidemia, and pancreatic cancer who presents due to worsening jaundice and icterus. Patient is otherwise asymptomatic.   Accompanied by Walter Joseph, daughter and [facility]), daughter who is on the phone. Patient presents today due to appearing jaundice. Dx with pancreatic cancer. Denies abdominal pain, darker colored urine (brownish or reddish) x 2-3 days, more pale bowel movements, no fever, chill or feeling fatigue or confused. Decreased appetite. Denies nausea. I sent patient for labs at [facility] (cmp, cbc, ca19, ggt, ammonia level.)  Bilirubin was 10.8, AST, ALT, ALK PHOS WERE UP. Meredith sent copies to his oncologist and gastroenterologist. His GI doctor is going to bring him in for an ercp.       01/16/2023    3:28 PM 10/02/2022    2:09 PM 06/29/2022    2:03 PM 06/29/2022    2:00 PM 06/30/2021    2:47 PM  Depression screen PHQ 2/9  Decreased Interest 0 0 0 0 0  Down, Depressed, Hopeless 0 0 0 0 0  PHQ - 2 Score 0 0 0 0 0  Altered sleeping  0 0 0   Tired, decreased energy  0 0 0   Change in appetite  0 1 1   Feeling bad or failure about yourself   0 0 0   Trouble concentrating  0 0 0   Moving slowly or fidgety/restless  0 0 0   Suicidal thoughts  0 0 0   PHQ-9 Score  0 1 1   Difficult doing work/chores  Not difficult at all Not difficult at all          10/02/2022    2:13 PM  Fall Risk   Falls in the past year? 1  Number falls in past yr: 1  Injury with Fall? 0  Risk for fall due to : No Fall Risks  Follow up Falls evaluation completed;Education provided    Patient Care Team: Blane Ohara, MD as PCP - General (Family Medicine) Evelena Leyden, OD (Optometry) Lance Bosch, MD as Referring Physician (Oncology)   Review of  Systems  Constitutional:  Negative for chills, diaphoresis, fatigue and fever.  HENT:  Negative for congestion, ear pain and sore throat.   Respiratory:  Negative for cough and shortness of breath.   Cardiovascular:  Negative for chest pain and leg swelling.  Gastrointestinal:  Negative for abdominal pain, constipation, diarrhea, nausea and vomiting.  Genitourinary:  Negative for dysuria and urgency.  Musculoskeletal:  Positive for back pain. Negative for arthralgias and myalgias.  Neurological:  Negative for dizziness and headaches.  Psychiatric/Behavioral:  Negative for dysphoric mood.     Current Outpatient Medications on File Prior to Visit  Medication Sig Dispense Refill   ALPRAZolam (XANAX) 0.5 MG tablet Take 1 tablet (0.5 mg total) by mouth 2 (two) times daily as needed for anxiety. 30 tablet 2   amLODipine (NORVASC) 5 MG tablet Take 5 mg by mouth daily.     esomeprazole (NEXIUM) 20 MG capsule Take 20 mg by mouth in the morning and at bedtime.     loratadine (CLARITIN) 10 MG tablet Take 10 mg by mouth daily.     Naproxen Sodium (ALEVE PO) Take by mouth.     REMERON  15 MG tablet Take 15 mg by mouth at bedtime.     spironolactone (ALDACTONE) 100 MG tablet Take 1 tablet (100 mg total) by mouth daily. (Patient taking differently: Take 25 mg by mouth daily.) 90 tablet 1   traMADol (ULTRAM) 50 MG tablet Take 50 mg by mouth 2 (two) times daily. Take 1/2 tablet BID     aspirin EC 81 MG tablet Take 81 mg by mouth daily.     Vitamin D, Ergocalciferol, (DRISDOL) 1.25 MG (50000 UNIT) CAPS capsule Take 1 capsule (50,000 Units total) by mouth once a week. 12 capsule 0   No current facility-administered medications on file prior to visit.   Past Medical History:  Diagnosis Date   Alcohol abuse    Atherosclerosis of aorta (HCC)    Cognitive communication deficit    Essential hypertension    Icterus 05/24/2022   Jaundice 05/24/2022   Mixed hyperlipidemia    Past Surgical History:   Procedure Laterality Date   CATARACT EXTRACTION Right 01/17/2022    Family History  Problem Relation Age of Onset   Stroke Mother    Heart failure Mother    Diabetes Mother    Stroke Father    Heart failure Father    Hypertension Father    Heart attack Father    Hyperlipidemia Other    Arrhythmia Other    Social History   Socioeconomic History   Marital status: Married    Spouse name: Not on file   Number of children: 3   Years of education: Not on file   Highest education level: Not on file  Occupational History   Occupation: retired  Tobacco Use   Smoking status: Former    Packs/day: 1    Types: Cigarettes    Quit date: 1994    Years since quitting: 30.4   Smokeless tobacco: Never  Substance and Sexual Activity   Alcohol use: Yes    Alcohol/week: 2.0 - 3.0 standard drinks of alcohol    Types: 2 - 3 Cans of beer per week   Drug use: Never   Sexual activity: Not on file  Other Topics Concern   Not on file  Social History Narrative   Not on file   Social Determinants of Health   Financial Resource Strain: Not on file  Food Insecurity: No Food Insecurity (05/29/2022)   Hunger Vital Sign    Worried About Running Out of Food in the Last Year: Never true    Ran Out of Food in the Last Year: Never true  Transportation Needs: No Transportation Needs (05/29/2022)   PRAPARE - Administrator, Civil Service (Medical): No    Lack of Transportation (Non-Medical): No  Physical Activity: Inactive (06/29/2021)   Exercise Vital Sign    Days of Exercise per Week: 0 days    Minutes of Exercise per Session: 0 min  Stress: Not on file  Social Connections: Not on file    Objective:  BP 132/70   Pulse 87   Temp (!) 97.3 F (36.3 C)   Ht 5\' 11"  (1.803 m)   Wt 137 lb (62.1 kg)   SpO2 98%   BMI 19.11 kg/m      01/16/2023    3:15 PM 10/02/2022    2:31 PM 10/02/2022    2:20 PM  BP/Weight  Systolic BP 132 132 148  Diastolic BP 70 68 78  Wt. (Lbs) 137  153.2   BMI 19.11 kg/m2  21.37 kg/m2  Physical Exam Vitals reviewed.  Constitutional:      Comments: Thin, frail  Eyes:     General: Scleral icterus present.  Cardiovascular:     Rate and Rhythm: Normal rate and regular rhythm.     Heart sounds: Normal heart sounds.  Pulmonary:     Effort: Pulmonary effort is normal.     Breath sounds: Normal breath sounds.  Abdominal:     General: Bowel sounds are normal. There is no distension.     Palpations: Abdomen is soft.     Tenderness: There is no abdominal tenderness.     Comments: No ascites.   Skin:    Coloration: Skin is jaundiced.  Neurological:     Mental Status: He is alert.     Diabetic Foot Exam - Simple   No data filed      Lab Results  Component Value Date   WBC 8.6 01/16/2023   HGB 14.0 01/16/2023   HCT 42 01/16/2023   PLT 239 01/16/2023   GLUCOSE 140 (H) 05/29/2022   CHOL 158 01/25/2022   TRIG 59 01/25/2022   HDL 93 01/25/2022   LDLCALC 53 01/25/2022   ALT 155 (A) 01/16/2023   AST 95 (A) 01/16/2023   NA 139 01/16/2023   K 3.9 01/16/2023   CL 106 01/16/2023   CREATININE 0.7 01/16/2023   BUN 14 01/16/2023   CO2 17 01/16/2023   INR 1.1 05/24/2022   HGBA1C 5.3 01/25/2022  Bilirubin 10.8 ALK PHOS: 465      Assessment & Plan:    Alcoholic cirrhosis of liver without ascites (HCC) Assessment & Plan: Discussed with patient and his family.  GI to schedule him this week at [facility].    Jaundice Assessment & Plan: Discussed with patient and his family.  GI to schedule him this week at [facility].    Severe protein-calorie malnutrition (HCC) Assessment & Plan: Patient needs to eat. He has a poor appetite.  Continues to lose weight.    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 Awaiting CA19. Not a surgical candidate. Patient did not wish to pursue aggressive management, but more comfort measures.    Weakness Assessment & Plan: Multifactorial.  Malnutrition,  progressive pancreatic cancer, liver cirrhosis with jaundice.       No orders of the defined types were placed in this encounter.   Orders Placed This Encounter  Procedures   CBC and differential   CBC   Basic metabolic panel   Comprehensive metabolic panel   Hepatic function panel     Follow-up: Return if symptoms worsen or fail to improve.   I,Katherina A Bramblett,acting as a scribe for Blane Ohara, MD.,have documented all relevant documentation on the behalf of Blane Ohara, MD,as directed by  Blane Ohara, MD while in the presence of Blane Ohara, MD.   An After Visit Summary was printed and given to the patient.  Blane Ohara, MD [facility] [phone]

## 2023-01-16 NOTE — Patient Instructions (Signed)
Push fluids

## 2023-01-18 ENCOUNTER — Encounter: Payer: Self-pay | Admitting: Family Medicine

## 2023-01-18 DIAGNOSIS — Z4689 Encounter for fitting and adjustment of other specified devices: Secondary | ICD-10-CM | POA: Diagnosis not present

## 2023-01-18 DIAGNOSIS — Z79899 Other long term (current) drug therapy: Secondary | ICD-10-CM | POA: Diagnosis not present

## 2023-01-18 DIAGNOSIS — K838 Other specified diseases of biliary tract: Secondary | ICD-10-CM | POA: Diagnosis not present

## 2023-01-18 DIAGNOSIS — K7030 Alcoholic cirrhosis of liver without ascites: Secondary | ICD-10-CM | POA: Diagnosis not present

## 2023-01-18 DIAGNOSIS — Z4659 Encounter for fitting and adjustment of other gastrointestinal appliance and device: Secondary | ICD-10-CM | POA: Diagnosis not present

## 2023-01-18 DIAGNOSIS — Z87891 Personal history of nicotine dependence: Secondary | ICD-10-CM | POA: Diagnosis not present

## 2023-01-18 DIAGNOSIS — I10 Essential (primary) hypertension: Secondary | ICD-10-CM | POA: Diagnosis not present

## 2023-01-18 DIAGNOSIS — Z9689 Presence of other specified functional implants: Secondary | ICD-10-CM | POA: Diagnosis not present

## 2023-01-18 DIAGNOSIS — K831 Obstruction of bile duct: Secondary | ICD-10-CM | POA: Diagnosis not present

## 2023-01-18 DIAGNOSIS — R531 Weakness: Secondary | ICD-10-CM | POA: Insufficient documentation

## 2023-01-18 DIAGNOSIS — C250 Malignant neoplasm of head of pancreas: Secondary | ICD-10-CM | POA: Diagnosis not present

## 2023-01-18 NOTE — Assessment & Plan Note (Signed)
Multifactorial.  Malnutrition, progressive pancreatic cancer, liver cirrhosis with jaundice.

## 2023-01-18 NOTE — Assessment & Plan Note (Addendum)
Management per specialist. Awaiting CA19. Not a surgical candidate. Patient did not wish to pursue aggressive management, but more comfort measures.

## 2023-01-18 NOTE — Assessment & Plan Note (Signed)
Patient needs to eat. He has a poor appetite.  Continues to lose weight.

## 2023-01-18 NOTE — Assessment & Plan Note (Signed)
Discussed with patient and his family.  GI to schedule him this week at [facility].

## 2023-01-18 NOTE — Assessment & Plan Note (Signed)
Discussed with patient and his family.  GI to schedule him this week at [facility].  

## 2023-01-21 DIAGNOSIS — I651 Occlusion and stenosis of basilar artery: Secondary | ICD-10-CM | POA: Diagnosis not present

## 2023-01-21 DIAGNOSIS — R339 Retention of urine, unspecified: Secondary | ICD-10-CM | POA: Diagnosis not present

## 2023-01-21 DIAGNOSIS — Z66 Do not resuscitate: Secondary | ICD-10-CM | POA: Diagnosis not present

## 2023-01-21 DIAGNOSIS — I6521 Occlusion and stenosis of right carotid artery: Secondary | ICD-10-CM | POA: Diagnosis not present

## 2023-01-21 DIAGNOSIS — Z4659 Encounter for fitting and adjustment of other gastrointestinal appliance and device: Secondary | ICD-10-CM | POA: Diagnosis not present

## 2023-01-21 DIAGNOSIS — R918 Other nonspecific abnormal finding of lung field: Secondary | ICD-10-CM | POA: Diagnosis not present

## 2023-01-21 DIAGNOSIS — K7460 Unspecified cirrhosis of liver: Secondary | ICD-10-CM | POA: Diagnosis not present

## 2023-01-21 DIAGNOSIS — I517 Cardiomegaly: Secondary | ICD-10-CM | POA: Diagnosis not present

## 2023-01-21 DIAGNOSIS — E871 Hypo-osmolality and hyponatremia: Secondary | ICD-10-CM | POA: Diagnosis not present

## 2023-01-21 DIAGNOSIS — K828 Other specified diseases of gallbladder: Secondary | ICD-10-CM | POA: Diagnosis not present

## 2023-01-21 DIAGNOSIS — C259 Malignant neoplasm of pancreas, unspecified: Secondary | ICD-10-CM | POA: Diagnosis not present

## 2023-01-21 DIAGNOSIS — R188 Other ascites: Secondary | ICD-10-CM | POA: Diagnosis not present

## 2023-01-21 DIAGNOSIS — D72829 Elevated white blood cell count, unspecified: Secondary | ICD-10-CM | POA: Diagnosis not present

## 2023-01-21 DIAGNOSIS — J9811 Atelectasis: Secondary | ICD-10-CM | POA: Diagnosis not present

## 2023-01-21 DIAGNOSIS — K869 Disease of pancreas, unspecified: Secondary | ICD-10-CM | POA: Diagnosis not present

## 2023-01-21 DIAGNOSIS — R509 Fever, unspecified: Secondary | ICD-10-CM | POA: Diagnosis not present

## 2023-01-21 DIAGNOSIS — K316 Fistula of stomach and duodenum: Secondary | ICD-10-CM | POA: Diagnosis not present

## 2023-01-21 DIAGNOSIS — Z9689 Presence of other specified functional implants: Secondary | ICD-10-CM | POA: Diagnosis not present

## 2023-01-21 DIAGNOSIS — I3481 Nonrheumatic mitral (valve) annulus calcification: Secondary | ICD-10-CM | POA: Diagnosis not present

## 2023-01-21 DIAGNOSIS — R29810 Facial weakness: Secondary | ICD-10-CM | POA: Diagnosis not present

## 2023-01-21 DIAGNOSIS — E43 Unspecified severe protein-calorie malnutrition: Secondary | ICD-10-CM | POA: Diagnosis not present

## 2023-01-21 DIAGNOSIS — K8309 Other cholangitis: Secondary | ICD-10-CM | POA: Diagnosis not present

## 2023-01-21 DIAGNOSIS — Z8507 Personal history of malignant neoplasm of pancreas: Secondary | ICD-10-CM | POA: Diagnosis not present

## 2023-01-21 DIAGNOSIS — R29818 Other symptoms and signs involving the nervous system: Secondary | ICD-10-CM | POA: Diagnosis not present

## 2023-01-21 DIAGNOSIS — R4182 Altered mental status, unspecified: Secondary | ICD-10-CM | POA: Diagnosis not present

## 2023-01-21 DIAGNOSIS — R000 Tachycardia, unspecified: Secondary | ICD-10-CM | POA: Diagnosis not present

## 2023-01-21 DIAGNOSIS — K766 Portal hypertension: Secondary | ICD-10-CM | POA: Diagnosis not present

## 2023-01-21 DIAGNOSIS — I358 Other nonrheumatic aortic valve disorders: Secondary | ICD-10-CM | POA: Diagnosis not present

## 2023-01-21 DIAGNOSIS — Z6821 Body mass index (BMI) 21.0-21.9, adult: Secondary | ICD-10-CM | POA: Diagnosis not present

## 2023-01-21 DIAGNOSIS — K838 Other specified diseases of biliary tract: Secondary | ICD-10-CM | POA: Diagnosis not present

## 2023-01-21 DIAGNOSIS — C786 Secondary malignant neoplasm of retroperitoneum and peritoneum: Secondary | ICD-10-CM | POA: Diagnosis not present

## 2023-01-21 DIAGNOSIS — J90 Pleural effusion, not elsewhere classified: Secondary | ICD-10-CM | POA: Diagnosis not present

## 2023-01-21 DIAGNOSIS — R1312 Dysphagia, oropharyngeal phase: Secondary | ICD-10-CM | POA: Diagnosis not present

## 2023-01-21 DIAGNOSIS — F419 Anxiety disorder, unspecified: Secondary | ICD-10-CM | POA: Diagnosis not present

## 2023-01-21 DIAGNOSIS — K810 Acute cholecystitis: Secondary | ICD-10-CM | POA: Diagnosis not present

## 2023-01-21 DIAGNOSIS — A419 Sepsis, unspecified organism: Secondary | ICD-10-CM | POA: Diagnosis not present

## 2023-01-21 DIAGNOSIS — K831 Obstruction of bile duct: Secondary | ICD-10-CM | POA: Diagnosis not present

## 2023-01-21 DIAGNOSIS — C250 Malignant neoplasm of head of pancreas: Secondary | ICD-10-CM | POA: Diagnosis not present

## 2023-01-21 DIAGNOSIS — G893 Neoplasm related pain (acute) (chronic): Secondary | ICD-10-CM | POA: Diagnosis not present

## 2023-01-21 DIAGNOSIS — R058 Other specified cough: Secondary | ICD-10-CM | POA: Diagnosis not present

## 2023-01-21 DIAGNOSIS — K820 Obstruction of gallbladder: Secondary | ICD-10-CM | POA: Diagnosis not present

## 2023-01-21 DIAGNOSIS — Z87891 Personal history of nicotine dependence: Secondary | ICD-10-CM | POA: Diagnosis not present

## 2023-01-23 ENCOUNTER — Ambulatory Visit: Payer: Medicare PPO | Admitting: Family Medicine

## 2023-01-30 ENCOUNTER — Encounter: Payer: Self-pay | Admitting: Family Medicine

## 2023-02-01 ENCOUNTER — Ambulatory Visit: Payer: Medicare PPO | Admitting: Family Medicine

## 2023-02-05 ENCOUNTER — Telehealth: Payer: Self-pay | Admitting: *Deleted

## 2023-02-05 ENCOUNTER — Encounter: Payer: Self-pay | Admitting: *Deleted

## 2023-02-05 DIAGNOSIS — K810 Acute cholecystitis: Secondary | ICD-10-CM | POA: Diagnosis not present

## 2023-02-05 DIAGNOSIS — L89151 Pressure ulcer of sacral region, stage 1: Secondary | ICD-10-CM | POA: Diagnosis not present

## 2023-02-05 DIAGNOSIS — K831 Obstruction of bile duct: Secondary | ICD-10-CM | POA: Diagnosis not present

## 2023-02-05 DIAGNOSIS — E43 Unspecified severe protein-calorie malnutrition: Secondary | ICD-10-CM | POA: Diagnosis not present

## 2023-02-05 DIAGNOSIS — G893 Neoplasm related pain (acute) (chronic): Secondary | ICD-10-CM | POA: Diagnosis not present

## 2023-02-05 DIAGNOSIS — K8309 Other cholangitis: Secondary | ICD-10-CM | POA: Diagnosis not present

## 2023-02-05 DIAGNOSIS — C259 Malignant neoplasm of pancreas, unspecified: Secondary | ICD-10-CM | POA: Diagnosis not present

## 2023-02-05 DIAGNOSIS — Z4803 Encounter for change or removal of drains: Secondary | ICD-10-CM | POA: Diagnosis not present

## 2023-02-05 DIAGNOSIS — A419 Sepsis, unspecified organism: Secondary | ICD-10-CM | POA: Diagnosis not present

## 2023-02-05 NOTE — Transitions of Care (Post Inpatient/ED Visit) (Signed)
02/05/2023  Name: Walter Joseph MRN: [id number] DOB: [birth year]  Today's TOC FU Call Status: Today's TOC FU Call Status:: Successful TOC FU Call Competed TOC FU Call Complete Date: 02/05/23  Transition Care Management Follow-up Telephone Call Date of Discharge: 02/02/23 Discharge Facility: Other ([facility]) Name of Other ([facility]) Discharge Facility: [facility] Type of Discharge: Inpatient Admission Primary Inpatient Discharge Diagnosis:: cholangitis post- recent ERCP; pancrreatic CA/ sepsis; drain placement How have you been since you were released from the hospital?: Better ("I am feeling stronger every day; the home health nurse just left; my daughter is a PA and she and my family are taking great care of me.") Any questions or concerns?: No  Items Reviewed: Did you receive and understand the discharge instructions provided?: Yes (briefly reviewed with patient/ caregiver who verbalizes good understanding of same - outside AVS) Medications obtained,verified, and reconciled?: Yes (Medications Reviewed) (Full medication reconciliation/ review completed; no concerns or discrepancies identified; confirmed patient obtained/ is taking all newly Rx'd medications as instructed; self-manages medications and denies questions/ concerns around medications today) Any new allergies since your discharge?: No Dietary orders reviewed?: Yes Type of Diet Ordered:: "As healthy as possible" Do you have support at home?: Yes People in Home: spouse Name of Support/Comfort Primary Source: Reports normally independent in self-care activities; requires assistance after recent hospitalization; supportive spouse/ daughters assists as/ if needed/ indicated- one daughter local; daughter Sharyl Nimrod is a PA- lives in [place]  Medications Reviewed Today: Medications Reviewed Today     Reviewed by Michaela Corner, RN (Registered Nurse) on 02/05/23 at 1510  Med List Status: <None>   Medication Order Taking?  Sig Documenting Provider Last Dose Status Informant  ALPRAZolam (XANAX) 0.5 MG tablet [id number] Yes Take 1 tablet (0.5 mg total) by mouth 2 (two) times daily as needed for anxiety. Cox, Kirsten, MD Taking Active   amLODipine (NORVASC) 5 MG tablet [id number] Yes Take 5 mg by mouth daily. [provider] Taking Active            Med Note Marilu Favre Feb 05, 2023  2:57 PM) 02/05/23: Reports during [facility] call they have been holding this medication due to "his blood pressures are normal;" encouraged to discuss with PCP when to give at home-- currently giving for BP's > 140/80 only  amoxicillin-clavulanate (AUGMENTIN) 875-125 MG tablet [id number] Yes Take 1 tablet by mouth 2 (two) times daily. Taking x 4 days post hospital discharge on 02/02/23 Blane Ohara, MD Taking Active Child           Med Note Marilu Favre Feb 05, 2023  3:01 PM) 02/05/23: reports during [facility] call this is a new Rx after recent hospitalization at [facility]- discharge 02/02/23   aspirin EC 81 MG tablet [id number] Yes Take 81 mg by mouth daily. [provider] Taking Active   atorvastatin (LIPITOR) 80 MG tablet [id number] Yes Take 80 mg by mouth daily. Blane Ohara, MD Taking Active Child           Med Note Marilu Favre Feb 05, 2023  3:01 PM) 02/05/23: reports during [facility] call this is a new Rx after recent hospitalization at [facility]- discharge 02/02/23  esomeprazole (NEXIUM) 20 MG capsule [id number] Yes Take 20 mg by mouth in the morning and at bedtime. [provider] Taking Active   fluconazole (DIFLUCAN) 200 MG tablet [id number] Yes Take 200 mg by mouth daily. Reports prescribed 02/02/23 at time of hospital  discharge- to take for 7 days Cox, Fritzi Mandes, MD Taking Active Child           Med Note Marilu Favre Feb 05, 2023  3:03 PM) 02/05/23: reports during [facility] call this is a new Rx after recent hospitalization at [facility]- discharge 02/02/23   loratadine (CLARITIN) 10 MG tablet [id number] Yes Take 10  mg by mouth daily. [provider] Taking Active   Naproxen Sodium (ALEVE PO) [id number] No Take by mouth.  Patient not taking: Reported on 02/05/2023   [provider] Not Taking Active            Med Note Marilu Favre Feb 05, 2023  3:10 PM) 02/05/23: Reports during [facility] call this was discontinued at time of hospital discharge from [facility] on 02/02/23  REMERON 15 MG tablet [id number] Yes Take 15 mg by mouth at bedtime. [provider] Taking Active            Med Note Marilu Favre Feb 05, 2023  3:04 PM) 02/05/23: reports during [facility] call dose was increased to 30 mg every day- HS/ after recent hospitalization at [facility]- discharge 02/02/23   spironolactone (ALDACTONE) 100 MG tablet [id number] Yes Take 1 tablet (100 mg total) by mouth daily.  Patient taking differently: Take 25 mg by mouth daily.   Blane Ohara, MD Taking Active            Med Note Marilu Favre Feb 05, 2023  3:06 PM) 02/05/23: reports during [facility] call this is on HOLD after recent hospitalization at [facility]- discharge 02/02/23: reports continues taking 25 mg "when he takes it;" currently verbalizes plans to discuss with PCP about whether or not to resume- this was encouraged   tamsulosin (FLOMAX) 0.4 MG CAPS capsule [id number] Yes Take 0.4 mg by mouth daily. Blane Ohara, MD Taking Active Child           Med Note Marilu Favre Feb 05, 2023  3:07 PM) 02/05/23: reports during [facility] call this is a new Rx after recent hospitalization at [facility]- discharge 02/02/23   traMADol (ULTRAM) 50 MG tablet [id number] Yes Take 50 mg by mouth 2 (two) times daily. Take 1/2 tablet BID [provider] Taking Active   Vitamin D, Ergocalciferol, (DRISDOL) 1.25 MG (50000 UNIT) CAPS capsule [id number] No Take 1 capsule (50,000 Units total) by mouth once a week.  Patient not taking: Reported on 02/05/2023   Blane Ohara, MD Not Taking Active            Med Note Marilu Favre Feb 05, 2023  3:08 PM)  02/05/23: reports during [facility] call this was discontinued at time of recent hospitalization at [facility]- discharge 02/02/23             Home Care and Equipment/Supplies: Were Home Health Services Ordered?: Yes [facility] of [facility] Health Agency:: [facility] Has Agency set up a time to come to your home?: Yes First Home Health Visit Date: 02/05/23 Any new equipment or medical supplies ordered?: No  Functional Questionnaire: Do you need assistance with bathing/showering or dressing?: Yes (supportive family assists as indicated after recent hospitalization) Do you need assistance with meal preparation?: Yes (supportive family assists as indicated after recent hospitalization) Do you need assistance with eating?: No Do you have difficulty maintaining continence: No Do you need assistance with getting out of bed/getting out of a chair/moving?: Yes (supportive  family assists as indicated after recent hospitalization) Do you have difficulty managing or taking your medications?: Yes (supportive family assists as indicated after recent hospitalization)  Follow up appointments reviewed: PCP Follow-up appointment confirmed?: NA (verified not indicated per hospital discharging provider discharge notes) Specialist Hospital Follow-up appointment confirmed?: Yes Date of Specialist follow-up appointment?: 02/19/23 (verified this is recommended time frame for follow up per hospital discharging provider notes) Follow-Up Specialty Provider:: cancer specialists at [facility] Do you need transportation to your follow-up appointment?: No Do you understand care options if your condition(s) worsen?: Yes-patient verbalized understanding  SDOH Interventions Today    Flowsheet Row Most Recent Value  SDOH Interventions   Food Insecurity Interventions Intervention Not Indicated  Transportation Interventions Intervention Not Indicated  [patient normally drives self,  family assisting after recent hospitalization]      TOC  Interventions Today    Flowsheet Row Most Recent Value  TOC Interventions   TOC Interventions Discussed/Reviewed TOC Interventions Discussed, Post op wound/incision care  [Patient declines need for ongoing/ further care coordination outreach,  no care coordination needs identified at time of TOC call today-- declines taking my direct contact information- daughter is a PA and communicates regularly with patient's care team]      Interventions Today    Flowsheet Row Most Recent Value  Chronic Disease   Chronic disease during today's visit Other  [pancreatic CA]  General Interventions   General Interventions Discussed/Reviewed General Interventions Discussed, Doctor Visits, Durable Medical Equipment (DME)  Doctor Visits Discussed/Reviewed Doctor Visits Discussed, Specialist, PCP  Durable Medical Equipment (DME) Bed side commode, Environmental consultant, Psychologist, forensic  PCP/Specialist Visits Compliance with follow-up visit  Nutrition Interventions   Nutrition Discussed/Reviewed Nutrition Discussed  Pharmacy Interventions   Pharmacy Dicussed/Reviewed Pharmacy Topics Discussed  [Full medication review with updating medication list in EHR per patient report]  Safety Interventions   Safety Discussed/Reviewed Safety Discussed      Caryl Pina, RN, BSN, CCRN Alumnus RN CM Care Coordination/ Transition of Care- [facility] Care Management [phone]: direct office

## 2023-02-06 ENCOUNTER — Encounter: Payer: Self-pay | Admitting: Family Medicine

## 2023-02-06 ENCOUNTER — Other Ambulatory Visit: Payer: Self-pay | Admitting: Family Medicine

## 2023-02-06 DIAGNOSIS — E43 Unspecified severe protein-calorie malnutrition: Secondary | ICD-10-CM | POA: Diagnosis not present

## 2023-02-06 DIAGNOSIS — C259 Malignant neoplasm of pancreas, unspecified: Secondary | ICD-10-CM | POA: Diagnosis not present

## 2023-02-06 DIAGNOSIS — Z4803 Encounter for change or removal of drains: Secondary | ICD-10-CM | POA: Diagnosis not present

## 2023-02-06 DIAGNOSIS — K8309 Other cholangitis: Secondary | ICD-10-CM | POA: Diagnosis not present

## 2023-02-06 DIAGNOSIS — G893 Neoplasm related pain (acute) (chronic): Secondary | ICD-10-CM | POA: Diagnosis not present

## 2023-02-06 DIAGNOSIS — L89151 Pressure ulcer of sacral region, stage 1: Secondary | ICD-10-CM | POA: Diagnosis not present

## 2023-02-06 DIAGNOSIS — K810 Acute cholecystitis: Secondary | ICD-10-CM | POA: Diagnosis not present

## 2023-02-06 DIAGNOSIS — A419 Sepsis, unspecified organism: Secondary | ICD-10-CM | POA: Diagnosis not present

## 2023-02-06 DIAGNOSIS — K831 Obstruction of bile duct: Secondary | ICD-10-CM | POA: Diagnosis not present

## 2023-02-06 MED ORDER — ROSUVASTATIN CALCIUM 5 MG PO TABS
5.0000 mg | ORAL_TABLET | Freq: Every day | ORAL | 0 refills | Status: DC
Start: 2023-02-06 — End: 2023-02-08

## 2023-02-07 ENCOUNTER — Encounter: Payer: Self-pay | Admitting: Family Medicine

## 2023-02-08 ENCOUNTER — Other Ambulatory Visit: Payer: Self-pay | Admitting: Family Medicine

## 2023-02-08 DIAGNOSIS — G8929 Other chronic pain: Secondary | ICD-10-CM | POA: Diagnosis not present

## 2023-02-08 MED ORDER — CIPROFLOXACIN HCL 500 MG PO TABS: 500.0000 mg | ORAL_TABLET | Freq: Two times a day (BID) | ORAL | 0 refills | Status: AC

## 2023-02-08 MED ORDER — NYSTATIN 100000 UNIT/GM EX POWD: 1.0000 | Freq: Three times a day (TID) | CUTANEOUS | 3 refills | Status: DC

## 2023-02-09 ENCOUNTER — Encounter: Payer: Self-pay | Admitting: Family Medicine

## 2023-02-12 ENCOUNTER — Telehealth: Payer: Self-pay | Admitting: Family Medicine

## 2023-02-12 NOTE — Telephone Encounter (Signed)
[facility] HOME HEALTH POC [id number]

## 2023-02-12 NOTE — Telephone Encounter (Signed)
[facility] ORDERS (HOSPICE&PCP)

## 2023-02-13 ENCOUNTER — Encounter: Payer: Self-pay | Admitting: Family Medicine

## 2023-02-13 ENCOUNTER — Telehealth: Payer: Self-pay

## 2023-02-13 ENCOUNTER — Other Ambulatory Visit: Payer: Self-pay | Admitting: Family Medicine

## 2023-02-13 DIAGNOSIS — F419 Anxiety disorder, unspecified: Secondary | ICD-10-CM

## 2023-02-13 DIAGNOSIS — K810 Acute cholecystitis: Secondary | ICD-10-CM | POA: Diagnosis not present

## 2023-02-13 DIAGNOSIS — L89151 Pressure ulcer of sacral region, stage 1: Secondary | ICD-10-CM | POA: Diagnosis not present

## 2023-02-13 DIAGNOSIS — E43 Unspecified severe protein-calorie malnutrition: Secondary | ICD-10-CM | POA: Diagnosis not present

## 2023-02-13 DIAGNOSIS — K831 Obstruction of bile duct: Secondary | ICD-10-CM | POA: Diagnosis not present

## 2023-02-13 DIAGNOSIS — G893 Neoplasm related pain (acute) (chronic): Secondary | ICD-10-CM | POA: Diagnosis not present

## 2023-02-13 DIAGNOSIS — K8309 Other cholangitis: Secondary | ICD-10-CM | POA: Diagnosis not present

## 2023-02-13 DIAGNOSIS — J9811 Atelectasis: Secondary | ICD-10-CM

## 2023-02-13 DIAGNOSIS — Z4803 Encounter for change or removal of drains: Secondary | ICD-10-CM | POA: Diagnosis not present

## 2023-02-13 DIAGNOSIS — C259 Malignant neoplasm of pancreas, unspecified: Secondary | ICD-10-CM | POA: Diagnosis not present

## 2023-02-13 DIAGNOSIS — K7460 Unspecified cirrhosis of liver: Secondary | ICD-10-CM

## 2023-02-13 DIAGNOSIS — A419 Sepsis, unspecified organism: Secondary | ICD-10-CM | POA: Diagnosis not present

## 2023-02-13 MED ORDER — NYSTATIN 100000 UNIT/ML MT SUSP: 5.0000 mL | Freq: Four times a day (QID) | OROMUCOSAL | 0 refills | Status: DC

## 2023-02-13 NOTE — Telephone Encounter (Signed)
PA submitted and approved via covermymeds for nystatin powder.

## 2023-02-14 ENCOUNTER — Other Ambulatory Visit: Payer: Self-pay | Admitting: Family Medicine

## 2023-02-14 ENCOUNTER — Telehealth: Payer: Self-pay | Admitting: Family Medicine

## 2023-02-14 MED ORDER — MAGNESIUM OXIDE 400 MG PO CAPS: 400.0000 mg | ORAL_CAPSULE | Freq: Every day | ORAL | 0 refills | Status: DC

## 2023-02-14 MED ORDER — DILTIAZEM HCL 30 MG PO TABS: 30.0000 mg | ORAL_TABLET | Freq: Four times a day (QID) | ORAL | 0 refills | Status: DC

## 2023-02-14 MED ORDER — POTASSIUM CHLORIDE CRYS ER 20 MEQ PO TBCR: 20.0000 meq | EXTENDED_RELEASE_TABLET | Freq: Every day | ORAL | 0 refills | Status: DC

## 2023-02-14 NOTE — Telephone Encounter (Signed)
[facility] HOME HEALTH ORDER   [id number]

## 2023-02-14 NOTE — Telephone Encounter (Signed)
Reviewed. Dr Birch Farino  °

## 2023-02-14 NOTE — Telephone Encounter (Signed)
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 Pulse is irregular and ranging from 120-170.  He is now with hospice.  He does not wish to go to ED.  [facility] will not draw blood for him unless they pay out of pocket. In the recent past he had low kcl and magnesium and when replaced his arrhythmia resolved.  I will send diltiazem 30 mg four times a day and he will see me tomorrow morning.  In addition, I will send magnesium oxide 400 mg daily. In addition, I will send kcl 20 meq once daily.   Sharyl Nimrod and her father understand that this is presumptive treatment until he can be seen. Lathan understands that an irregular rapid rhythm can lead to ami,chf,and possible cardiac death.   Dr. Sedalia Muta

## 2023-02-15 ENCOUNTER — Telehealth: Payer: Self-pay | Admitting: Family Medicine

## 2023-02-15 ENCOUNTER — Ambulatory Visit: Payer: Medicare PPO | Admitting: Family Medicine

## 2023-02-15 NOTE — Telephone Encounter (Signed)
His daughter, Sharyl Nimrod, called and patient is feeling too weak to come in.  His arrhythmia resolved with diltiazem 30 mg four times a day. Started magnesium and potassium. Per his daughter he is no longer in irregular, rapid rhythm.  BP is normal. He is eating some.  Will cancel appt for this morning.  Dr. Sedalia Muta

## 2023-02-16 ENCOUNTER — Encounter: Payer: Self-pay | Admitting: Family Medicine

## 2023-02-19 ENCOUNTER — Encounter: Payer: Self-pay | Admitting: Family Medicine

## 2023-02-19 ENCOUNTER — Other Ambulatory Visit: Payer: Self-pay | Admitting: Family Medicine

## 2023-02-19 ENCOUNTER — Telehealth: Payer: Self-pay

## 2023-02-19 NOTE — Telephone Encounter (Signed)
[facility] order #: [id number]

## 2023-02-20 ENCOUNTER — Other Ambulatory Visit: Payer: Self-pay | Admitting: Family Medicine

## 2023-02-20 ENCOUNTER — Telehealth: Payer: Self-pay | Admitting: Family Medicine

## 2023-02-20 MED ORDER — CEFEPIME HCL 1 G IJ SOLR: 1.0000 g | Freq: Two times a day (BID) | INTRAMUSCULAR | 0 refills | Status: DC

## 2023-02-20 NOTE — Telephone Encounter (Signed)
[facility] ORDER # [id number]

## 2023-02-26 ENCOUNTER — Telehealth: Payer: Self-pay

## 2023-02-26 NOTE — Telephone Encounter (Signed)
Mandy from [facility] care called and stated that patient's daughter called requesting if another UA could be done on the patient because she believes that the one that was done last time was not a clean catch due to the Well water that they use to wash out the patient's urinal. Also they were inquiring about Rx for the UTI that the patient's daughter is concerned about. Consulted with Dr. Sedalia Muta on the request and per provider it is ok to recheck Urinalysis, but as far as medication it will not be determined until we received the UA results per provider. Mandy from Hospice informed and understood verbally.

## 2023-02-27 ENCOUNTER — Encounter: Payer: Self-pay | Admitting: Family Medicine

## 2023-02-27 ENCOUNTER — Telehealth: Payer: Self-pay

## 2023-02-27 MED ORDER — CIPROFLOXACIN HCL 250 MG PO TABS: 250.0000 mg | ORAL_TABLET | Freq: Two times a day (BID) | ORAL | 0 refills | Status: DC

## 2023-02-27 NOTE — Telephone Encounter (Signed)
Received Fax from [facility] care from [place] which is patient's nurse. She faxed over Urine results and per Dr. Sedalia Muta she recommended Cipro 250 mg 1 tablet twice daily for 7 days = 14 tablets and per [facility] send Rx to [facility] [address]. Angelica Chessman states that she will notify the patient and patient's daughter of recommendations.

## 2023-02-28 ENCOUNTER — Telehealth: Payer: Self-pay | Admitting: Family Medicine

## 2023-02-28 ENCOUNTER — Telehealth: Payer: Self-pay

## 2023-02-28 NOTE — Telephone Encounter (Signed)
Walter Joseph order #[id number]

## 2023-02-28 NOTE — Telephone Encounter (Signed)
Per Dr. Neldon Newport (pts daughter) informed urine culture did not show growth and patient may stop cipro. Verbalized understanding.

## 2023-03-01 ENCOUNTER — Encounter: Payer: Self-pay | Admitting: Family Medicine

## 2023-03-01 ENCOUNTER — Telehealth: Payer: Self-pay

## 2023-03-01 NOTE — Telephone Encounter (Signed)
Patient's daughter Sharyl Nimrod called stating that [facility] is needing Rx to be sent to [facility] for patients Normal Saline Flush Syringe 10 cc. She states that he uses 2 syringes daily so this would be #60 with 2 refills to the pharmacy. Called and gave the verbal order for Normal Saline flush syringe 1cc use as directed twice daily, #60 with 2 refills.

## 2023-03-01 NOTE — Telephone Encounter (Signed)
[facility] nurses called patient's daughter to let her know about the urine culture. kc

## 2023-03-02 ENCOUNTER — Telehealth: Payer: Self-pay

## 2023-03-02 MED ORDER — METRONIDAZOLE 500 MG PO TABS: 500.0000 mg | ORAL_TABLET | Freq: Two times a day (BID) | ORAL | 0 refills | Status: DC

## 2023-03-02 NOTE — Telephone Encounter (Signed)
Kristen from [facility] care called stating that she was calling to request something for patient's diarrhea stating that it started on Wednesday and now its gotten worse. She is asking what your recommendation would be. After Consulting with Dr. Sedalia Muta , Dr. Sedalia Muta recommends that the patient be treated for Cdiff again and recommends Flagyl 500 mg 1 tablet twice daily for 7 days. Called and informed Nurse Kristen from [facility] and she asked [place] to send the rx to the pharmacy. Sending to [facility] per there request.

## 2023-03-05 ENCOUNTER — Telehealth: Payer: Self-pay

## 2023-03-05 ENCOUNTER — Other Ambulatory Visit: Payer: Self-pay | Admitting: Family Medicine

## 2023-03-05 MED ORDER — METRONIDAZOLE 500 MG PO TABS: 500.0000 mg | ORAL_TABLET | Freq: Three times a day (TID) | ORAL | 0 refills | Status: DC

## 2023-03-05 NOTE — Telephone Encounter (Signed)
Sharyl Nimrod called requesting an increase in her Dad's flagyl dose and a refill on the medication.  His diarrhea has improved but continues.  Dr. Sedalia Muta approved for the flagyl to be increased to three times daily and a refill was sent for 7 additional days.  Sharyl Nimrod was informe.

## 2023-03-05 NOTE — Telephone Encounter (Signed)
[facility] order #: [id number]

## 2023-03-08 ENCOUNTER — Telehealth: Payer: Self-pay

## 2023-03-08 MED ORDER — CETIRIZINE HCL 10 MG PO TABS: 10.0000 mg | ORAL_TABLET | Freq: Every day | ORAL | 1 refills | Status: DC

## 2023-03-08 MED ORDER — MELATONIN 5 MG PO TABS: 5.0000 mg | ORAL_TABLET | Freq: Every evening | ORAL | 1 refills | Status: DC | PRN

## 2023-03-08 MED ORDER — FLUCONAZOLE 150 MG PO TABS: 150.0000 mg | ORAL_TABLET | Freq: Every day | ORAL | 0 refills | Status: DC

## 2023-03-08 NOTE — Telephone Encounter (Signed)
Hospice called stating patient has a rash and also has thrush of the mouth and is not sleeping well. Per Dr. Sedalia Muta send zyrtec 10 mg 1 tablet daily. #30, Diflucan 150 mg 1 tablet daily for #7 days, Melatonin 5 mg 1 tablet at night time #30. Sent Rx's to [facility].

## 2023-03-13 ENCOUNTER — Telehealth: Payer: Self-pay | Admitting: Family Medicine

## 2023-03-13 NOTE — Telephone Encounter (Signed)
[facility] home health order [id number]

## 2023-03-19 ENCOUNTER — Telehealth: Payer: Self-pay

## 2023-03-19 ENCOUNTER — Other Ambulatory Visit: Payer: Self-pay

## 2023-03-19 MED ORDER — TRAMADOL HCL 50 MG PO TABS: 50.0000 mg | ORAL_TABLET | Freq: Three times a day (TID) | ORAL | 0 refills | Status: DC

## 2023-03-19 MED ORDER — TRAMADOL HCL 50 MG PO TABS: 50.0000 mg | ORAL_TABLET | Freq: Three times a day (TID) | ORAL | Status: DC

## 2023-03-19 NOTE — Telephone Encounter (Signed)
Baxter Hire, RN with [facility] called stating over the weekend patient c/o abd pain, shortly after had 5 BM's Sat, 3 BM's Sun and 1 BM today. All of which have been soft and formed. No diarrhea. Has been drinking some and eating small bites of food. Wynelle Link had a temperature of 101.4, family has been administering tylenol, today temp is 99.6. Family has also been administering tramadol 50 mg on schedule vs as needed which patient seems to be more comfortable with this regimen. Kristen requested VO to continue administering tylenol as needed for temperature and to "increase" tramadol to scheduled vs prn. Per Dr. Sedalia Muta, ok to continue with tylenol as needed for temperature and tramadol to be given q8 hours. VO given to [place], rx pended for provider to send.

## 2023-04-02 ENCOUNTER — Other Ambulatory Visit: Payer: Self-pay

## 2023-04-02 DIAGNOSIS — R188 Other ascites: Secondary | ICD-10-CM

## 2023-04-02 MED ORDER — SPIRONOLACTONE 100 MG PO TABS
100.0000 mg | ORAL_TABLET | Freq: Every day | ORAL | 1 refills | Status: DC
Start: 2023-04-02 — End: 2023-05-01

## 2023-04-03 ENCOUNTER — Other Ambulatory Visit: Payer: Self-pay | Admitting: Family Medicine

## 2023-04-03 ENCOUNTER — Telehealth: Payer: Self-pay

## 2023-04-03 ENCOUNTER — Encounter: Payer: Self-pay | Admitting: Family Medicine

## 2023-04-03 MED ORDER — NYSTATIN 100000 UNIT/ML MT SUSP: 5.0000 mL | Freq: Four times a day (QID) | OROMUCOSAL | 0 refills | Status: DC

## 2023-04-03 NOTE — Telephone Encounter (Signed)
Morrie Sheldon from [facility] health called stating that she went out to see the patient today and that the daughter states he has thrush again. She states that the tongue is appearing to be white and wanted her to call to see if Dr. Sedalia Muta would like to send something in for his thrush. Please advise.   [facility] phone number: [phone]

## 2023-04-05 ENCOUNTER — Telehealth: Payer: Self-pay

## 2023-04-05 NOTE — Telephone Encounter (Signed)
Heather the Hospice Nurse called with to report that they are having problems getting the nystatin for Walter Joseph.  They have 3 days worth but they have been trying to locate more.  Dr. Sedalia Muta is willing to substitute if needed.  The pharmacist had recommended florastor.  Dr. Sedalia Muta approved the florastor to be given daily.

## 2023-04-05 NOTE — Telephone Encounter (Signed)
[facility] health called. She was notified of the Nystatin being sent in.

## 2023-04-16 ENCOUNTER — Other Ambulatory Visit: Payer: Self-pay

## 2023-04-16 MED ORDER — DILTIAZEM HCL 30 MG PO TABS: 30.0000 mg | ORAL_TABLET | Freq: Four times a day (QID) | ORAL | 0 refills | Status: DC

## 2023-04-16 NOTE — Telephone Encounter (Signed)
Patient's daughter called to say that she was given the incorrect information by the pharmacist.  She is requesting FloraJEN instead of FloraSTOR.  Also requesting refill of Cardizem, which she states that pharmacy has sent over.

## 2023-04-24 ENCOUNTER — Other Ambulatory Visit: Payer: Self-pay

## 2023-04-24 ENCOUNTER — Telehealth: Payer: Self-pay | Admitting: Family Medicine

## 2023-04-24 ENCOUNTER — Other Ambulatory Visit: Payer: Self-pay | Admitting: Family Medicine

## 2023-04-24 MED ORDER — PROMETHAZINE HCL 25 MG PO TABS: 25.0000 mg | ORAL_TABLET | Freq: Four times a day (QID) | ORAL | 0 refills | Status: DC | PRN

## 2023-04-24 MED ORDER — CETIRIZINE HCL 10 MG PO TABS: 10.0000 mg | ORAL_TABLET | Freq: Every day | ORAL | 1 refills | Status: DC

## 2023-04-24 NOTE — Telephone Encounter (Signed)
Discussed with Sharyl Nimrod.  He currently is taking zofran 8 mg and remains very nauseated.  She is willing for him to try compazine or phenergan.  He currently is bed bound and she is aware of drowsiness and increased risk for a fall.

## 2023-04-24 NOTE — Telephone Encounter (Signed)
Pt daughter has concerns that her father is not eating and his nausea has increased and the zofran seems to not be working. Daughter wants to know if anything could be called in to help him. I told her a clinical nurse will call her if there is anymore questions and if medication is called in to the pharm that she will notified.

## 2023-04-26 ENCOUNTER — Ambulatory Visit: Payer: Medicare PPO | Admitting: Family Medicine

## 2023-04-27 ENCOUNTER — Other Ambulatory Visit: Payer: Self-pay | Admitting: Family Medicine

## 2023-04-27 MED ORDER — MORPHINE SULFATE (CONCENTRATE) 20 MG/ML PO SOLN: 5.0000 mg | ORAL | 0 refills | Status: DC | PRN

## 2023-04-30 ENCOUNTER — Telehealth: Payer: Self-pay

## 2023-04-30 ENCOUNTER — Other Ambulatory Visit: Payer: Self-pay

## 2023-04-30 ENCOUNTER — Encounter: Payer: Self-pay | Admitting: Family Medicine

## 2023-04-30 NOTE — Telephone Encounter (Signed)
[facility]-ORDER NUMBER: [id number]

## 2023-05-22 DEATH — deceased
